# Patient Record
Sex: Female | Born: 1995 | Race: Black or African American | Hispanic: No | Marital: Single | Smoking: Former smoker
Health system: Southern US, Community
[De-identification: ages and names within clinical notes are randomized; demographics above are authoritative.]

## PROBLEM LIST (undated history)

## (undated) DIAGNOSIS — N3944 Nocturnal enuresis: Secondary | ICD-10-CM

## (undated) DIAGNOSIS — E282 Polycystic ovarian syndrome: Secondary | ICD-10-CM

## (undated) DIAGNOSIS — H6690 Otitis media, unspecified, unspecified ear: Secondary | ICD-10-CM

## (undated) DIAGNOSIS — F32A Depression, unspecified: Secondary | ICD-10-CM

## (undated) DIAGNOSIS — D649 Anemia, unspecified: Secondary | ICD-10-CM

## (undated) DIAGNOSIS — F419 Anxiety disorder, unspecified: Secondary | ICD-10-CM

## (undated) HISTORY — DX: Depression, unspecified: F32.A

## (undated) HISTORY — PX: TONSILLECTOMY: SUR1361

## (undated) HISTORY — DX: Polycystic ovarian syndrome: E28.2

## (undated) HISTORY — DX: Nocturnal enuresis: N39.44

## (undated) HISTORY — DX: Anemia, unspecified: D64.9

## (undated) HISTORY — DX: Otitis media, unspecified, unspecified ear: H66.90

## (undated) HISTORY — PX: ADENOIDECTOMY: SUR15

## (undated) HISTORY — DX: Anxiety disorder, unspecified: F41.9

---
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
## 2016-01-12 ENCOUNTER — Emergency Department (HOSPITAL_COMMUNITY)
Admission: EM | Admit: 2016-01-12 | Discharge: 2016-01-12 | Disposition: A | Discharge status: Home / Self Care | Payer: Federal, State, Local not specified - PPO | Source: Home / Self Care | Attending: Family Medicine | Admitting: Family Medicine

## 2016-01-12 ENCOUNTER — Encounter (HOSPITAL_COMMUNITY): Payer: Self-pay

## 2016-01-12 ENCOUNTER — Other Ambulatory Visit (HOSPITAL_COMMUNITY)
Admission: RE | Admit: 2016-01-12 | Discharge: 2016-01-12 | Disposition: A | Discharge status: Home / Self Care | Payer: Federal, State, Local not specified - PPO | Source: Ambulatory Visit | Attending: Family Medicine | Admitting: Family Medicine

## 2016-01-12 DIAGNOSIS — J069 Acute upper respiratory infection, unspecified: Secondary | ICD-10-CM | POA: Diagnosis not present

## 2016-01-12 LAB — POCT RAPID STREP A: STREPTOCOCCUS, GROUP A SCREEN (DIRECT): NEGATIVE

## 2016-01-12 NOTE — ED Notes (Signed)
Patient complains of sore throat cough congestion for the past two weeks Patient states she took a zyrtec one day Denies any fever

## 2016-01-12 NOTE — ED Provider Notes (Signed)
CSN: [id number]     Arrival date & time 01/12/16  1922 History   First MD Initiated Contact with Patient 01/12/16 2040     Chief Complaint  Patient presents with  . Sore Throat   (Consider location/radiation/quality/duration/timing/severity/associated sxs/prior Treatment) HPI Cold symptoms for about 2 weeks. Treated today only with zrytec. Runny nose congestion and sore throat Past Medical History  Diagnosis Date  . OM (otitis media)   . Nocturnal enuresis     hx seen by Dr. Isabel Caprice in the past  . Anemia    Past Surgical History  Procedure Laterality Date  . Adenoidectomy    . Tonsillectomy     Family History  Problem Relation Age of Onset  . Hypertension Mother   . Other Mother     abn ekg but nl echo and ht rx  . Allergy (severe)    . Other      enlarged heart maternal uncle died in sleep age 70  . Diabetes type II     Social History  Substance Use Topics  . Smoking status: Never Smoker   . Smokeless tobacco: None  . Alcohol Use: No   OB History    Gravida Para Term Preterm AB TAB SAB Ectopic Multiple Living   0              Review of Systems ROS +'vesore throat  Denies: HEADACHE, NAUSEA, ABDOMINAL PAIN, CHEST PAIN, CONGESTION, DYSURIA, SHORTNESS OF BREATH   Allergies  Review of patient's allergies indicates no known allergies.  Home Medications   Prior to Admission medications   Medication Sig Start Date End Date Taking? Authorizing Provider  medroxyPROGESTERone (PROVERA) 10 MG tablet Take 1 tablet (10 mg total) by mouth daily. 04/08/14   Brook Rosalin Hawking, MD  meloxicam (MOBIC) 7.5 MG tablet Use 1 tablet for pain, may increase to 2 but not more in one day. 10/27/14   Wallis Bamberg, PA-C  norethindrone-ethinyl estradiol (JUNEL FE,GILDESS FE,LOESTRIN FE) 1-20 MG-MCG tablet Take 1 tablet by mouth daily. 04/23/14   Brook Rosalin Hawking, MD   Meds Ordered and Administered this Visit  Medications - No data to display  BP 148/84 mmHg  Pulse 88   Temp(Src) 98.3 F (36.8 C) (Oral)  Resp 18  SpO2 100% No data found.   Physical Exam  Constitutional: She is oriented to person, place, and time. She appears well-developed and well-nourished.  HENT:  Head: Normocephalic and atraumatic.  Right Ear: External ear normal.  Left Ear: External ear normal.  Eyes: Conjunctivae are normal.  Neck: Normal range of motion. Neck supple.  Pulmonary/Chest: Effort normal and breath sounds normal.  Abdominal: Soft.  Musculoskeletal: Normal range of motion.  Neurological: She is alert and oriented to person, place, and time.  Skin: Skin is warm and dry.  Psychiatric: She has a normal mood and affect. Her behavior is normal.  Nursing note and vitals reviewed.   ED Course  Procedures (including critical care time)  Labs Review Labs Reviewed - No data to display  Imaging Review No results found.   Visual Acuity Review  Right Eye Distance:   Left Eye Distance:   Bilateral Distance:    Right Eye Near:   Left Eye Near:    Bilateral Near:         MDM   1. URI (upper respiratory infection)    Patient is advised to continue home symptomatic treatment.  Patient is advised that if there are new  or worsening symptoms or attend the emergency department, or contact primary care provider. Instructions of care provided discharged home in stable condition. Return to work/school note provided.  THIS NOTE WAS GENERATED USING A VOICE RECOGNITION SOFTWARE PROGRAM. ALL REASONABLE EFFORTS  WERE MADE TO PROOFREAD THIS DOCUMENT FOR ACCURACY.     Tharon Aquas, PA 01/12/16 2059

## 2016-01-12 NOTE — Discharge Instructions (Signed)
Cough, Adult °A cough helps to clear your throat and lungs. A cough may last only 2-3 weeks (acute), or it may last longer than 8 weeks (chronic). Many different things can cause a cough. A cough may be a sign of an illness or another medical condition. °HOME CARE °· Pay attention to any changes in your cough. °· Take medicines only as told by your doctor. °¨ If you were prescribed an antibiotic medicine, take it as told by your doctor. Do not stop taking it even if you start to feel better. °¨ Talk with your doctor before you try using a cough medicine. °· Drink enough fluid to keep your pee (urine) clear or pale yellow. °· If the air is dry, use a cold steam vaporizer or humidifier in your home. °· Stay away from things that make you cough at work or at home. °· If your cough is worse at night, try using extra pillows to raise your head up higher while you sleep. °· Do not smoke, and try not to be around smoke. If you need help quitting, ask your doctor. °· Do not have caffeine. °· Do not drink alcohol. °· Rest as needed. °GET HELP IF: °· You have new problems (symptoms). °· You cough up yellow fluid (pus). °· Your cough does not get better after 2-3 weeks, or your cough gets worse. °· Medicine does not help your cough and you are not sleeping well. °· You have pain that gets worse or pain that is not helped with medicine. °· You have a fever. °· You are losing weight and you do not know why. °· You have night sweats. °GET HELP RIGHT AWAY IF: °· You cough up blood. °· You have trouble breathing. °· Your heartbeat is very fast. °  °This information is not intended to replace advice given to you by your health care provider. Make sure you discuss any questions you have with your health care provider. °  °Document Released: 08/10/2011 Document Revised: 08/18/2015 Document Reviewed: 02/03/2015 °Elsevier Interactive Patient Education ©2016 [employer] ° °Pharyngitis °Pharyngitis is a sore throat (pharynx). There is  redness, pain, and swelling of your throat. °HOME CARE  °· Drink enough fluids to keep your pee (urine) clear or pale yellow. °· Only take medicine as told by your doctor. °¨ You may get sick again if you do not take medicine as told. Finish your medicines, even if you start to feel better. °¨ Do not take aspirin. °· Rest. °· Rinse your mouth (gargle) with salt water (½ tsp of salt per 1 qt of water) every 1-2 hours. This will help the pain. °· If you are not at risk for choking, you can suck on hard candy or sore throat lozenges. °GET HELP IF: °· You have large, tender lumps on your neck. °· You have a rash. °· You cough up green, yellow-brown, or bloody spit. °GET HELP RIGHT AWAY IF:  °· You have a stiff neck. °· You drool or cannot swallow liquids. °· You throw up (vomit) or are not able to keep medicine or liquids down. °· You have very bad pain that does not go away with medicine. °· You have problems breathing (not from a stuffy nose). °MAKE SURE YOU:  °· Understand these instructions. °· Will watch your condition. °· Will get help right away if you are not doing well or get worse. °  °This information is not intended to replace advice given to you by your health care   provider. Make sure you discuss any questions you have with your health care provider. °  °Document Released: 05/15/2008 Document Revised: 09/17/2013 Document Reviewed: 08/04/2013 °Elsevier Interactive Patient Education ©2016 Elsevier Inc. ° °

## 2016-01-15 LAB — CULTURE, GROUP A STREP (THRC)

## 2017-01-12 DIAGNOSIS — A6000 Herpesviral infection of urogenital system, unspecified: Secondary | ICD-10-CM | POA: Insufficient documentation

## 2017-06-03 ENCOUNTER — Emergency Department (HOSPITAL_COMMUNITY): Payer: Federal, State, Local not specified - PPO

## 2017-06-03 ENCOUNTER — Encounter (HOSPITAL_COMMUNITY): Payer: Self-pay | Admitting: Emergency Medicine

## 2017-06-03 ENCOUNTER — Emergency Department (HOSPITAL_COMMUNITY)
Admission: EM | Admit: 2017-06-03 | Discharge: 2017-06-03 | Disposition: A | Discharge status: Home / Self Care | Payer: Federal, State, Local not specified - PPO | Attending: Emergency Medicine | Admitting: Emergency Medicine

## 2017-06-03 DIAGNOSIS — R102 Pelvic and perineal pain: Secondary | ICD-10-CM

## 2017-06-03 DIAGNOSIS — N760 Acute vaginitis: Secondary | ICD-10-CM | POA: Insufficient documentation

## 2017-06-03 DIAGNOSIS — R1030 Lower abdominal pain, unspecified: Secondary | ICD-10-CM | POA: Diagnosis present

## 2017-06-03 DIAGNOSIS — B9689 Other specified bacterial agents as the cause of diseases classified elsewhere: Secondary | ICD-10-CM

## 2017-06-03 DIAGNOSIS — F17200 Nicotine dependence, unspecified, uncomplicated: Secondary | ICD-10-CM | POA: Insufficient documentation

## 2017-06-03 LAB — URINALYSIS, ROUTINE W REFLEX MICROSCOPIC
Bacteria, UA: NONE SEEN
Bilirubin Urine: NEGATIVE
GLUCOSE, UA: NEGATIVE mg/dL
Ketones, ur: NEGATIVE mg/dL
NITRITE: NEGATIVE
PH: 6 (ref 5.0–8.0)
Protein, ur: NEGATIVE mg/dL
RBC / HPF: NONE SEEN RBC/hpf (ref 0–5)
SPECIFIC GRAVITY, URINE: 1.018 (ref 1.005–1.030)

## 2017-06-03 LAB — POC URINE PREG, ED: Preg Test, Ur: NEGATIVE

## 2017-06-03 LAB — BASIC METABOLIC PANEL
ANION GAP: 8 (ref 5–15)
BUN: 11 mg/dL (ref 6–20)
CO2: 25 mmol/L (ref 22–32)
Calcium: 9.7 mg/dL (ref 8.9–10.3)
Chloride: 104 mmol/L (ref 101–111)
Creatinine, Ser: 0.85 mg/dL (ref 0.44–1.00)
Glucose, Bld: 101 mg/dL — ABNORMAL HIGH (ref 65–99)
Potassium: 3.9 mmol/L (ref 3.5–5.1)
Sodium: 137 mmol/L (ref 135–145)

## 2017-06-03 LAB — AMYLASE: AMYLASE: 91 U/L (ref 28–100)

## 2017-06-03 LAB — WET PREP, GENITAL
SPERM: NONE SEEN
Trich, Wet Prep: NONE SEEN
YEAST WET PREP: NONE SEEN

## 2017-06-03 MED ORDER — IBUPROFEN 200 MG PO TABS
600.0000 mg | ORAL_TABLET | Freq: Once | ORAL | Status: AC
  Administered 2017-06-03: 600 mg via ORAL
  Filled 2017-06-03: qty 1

## 2017-06-03 MED ORDER — METRONIDAZOLE 500 MG PO TABS: 500.0000 mg | ORAL_TABLET | Freq: Two times a day (BID) | ORAL | 0 refills | Status: DC

## 2017-06-03 NOTE — ED Provider Notes (Signed)
[facility]-EMERGENCY DEPT Provider Note   CSN: [id number] Arrival date & time: 06/03/17  4098     History   Chief Complaint Chief Complaint  Patient presents with  . Abdominal Pain    HPI April Montes is a 21 y.o. female.  The history is provided by the patient.  Abdominal Pain   This is a recurrent problem. The current episode started 1 to 2 hours ago. The problem occurs constantly. The problem has not changed since onset.The pain is associated with an unknown factor. The pain is located in the suprapubic region. The quality of the pain is sharp. The pain is moderate. Pertinent negatives include fever, diarrhea, nausea, vomiting, dysuria, frequency and hematuria. The symptoms are aggravated by palpation. Nothing relieves the symptoms.   21 year old female who presents with low abdominal pain. She has a history of oligomenorrhea. Has had intermittent low abdominal pain in her pelvis for one or 2 months. Pain of spontaneous onset, lasting for a few hours before self resolving. This episode woke her up from sleep this morning. I did not take anything for pain. No other alleviating factors. No Vaginal bleeding or vaginal discharge, fevers, nausea or vomiting, urinary complaints, diarrhea/constipation. Past Medical History:  Diagnosis Date  . Anemia   . Nocturnal enuresis    hx seen by Dr. Isabel Caprice in the past  . OM (otitis media)     Patient Active Problem List   Diagnosis Date Noted  . Primary oligomenorrhea 03/09/2014  . Irregular periods 03/09/2014  . Well adolescent visit 03/14/2013  . Back pain 01/18/2012  . Poor eating habits 01/18/2012  . ANEMIA 08/26/2010  . SCOLIOSIS 08/12/2009    Past Surgical History:  Procedure Laterality Date  . ADENOIDECTOMY    . TONSILLECTOMY      OB History    Gravida Para Term Preterm AB Living   0             SAB TAB Ectopic Multiple Live Births                   Home Medications    Prior to Admission medications   Medication  Sig Start Date End Date Taking? Authorizing Provider  medroxyPROGESTERone (PROVERA) 10 MG tablet Take 1 tablet (10 mg total) by mouth daily. Patient not taking: Reported on 06/03/2017 04/08/14   Patton Salles, MD  meloxicam (MOBIC) 7.5 MG tablet Use 1 tablet for pain, may increase to 2 but not more in one day. Patient not taking: Reported on 06/03/2017 10/27/14   Wallis Bamberg, PA-C  metroNIDAZOLE (FLAGYL) 500 MG tablet Take 1 tablet (500 mg total) by mouth 2 (two) times daily. 06/03/17   Lavera Guise, MD  norethindrone-ethinyl estradiol (JUNEL FE,GILDESS FE,LOESTRIN FE) 1-20 MG-MCG tablet Take 1 tablet by mouth daily. Patient not taking: Reported on 06/03/2017 04/23/14   Patton Salles, MD    Family History Family History  Problem Relation Age of Onset  . Hypertension Mother   . Other Mother        abn ekg but nl echo and ht rx  . Allergy (severe) Unknown   . Other Unknown        enlarged heart maternal uncle died in sleep age 33  . Diabetes type II Unknown     Social History Social History  Substance Use Topics  . Smoking status: Current Some Day Smoker  . Smokeless tobacco: Current User  . Alcohol use No  Allergies   Patient has no known allergies.   Review of Systems Review of Systems  Constitutional: Negative for fever.  Respiratory: Negative for shortness of breath.   Cardiovascular: Negative for chest pain.  Gastrointestinal: Positive for abdominal pain. Negative for diarrhea, nausea and vomiting.  Genitourinary: Negative for dysuria, frequency and hematuria.  Allergic/Immunologic: Negative for immunocompromised state.  Hematological: Does not bruise/bleed easily.  All other systems reviewed and are negative.    Physical Exam Updated Vital Signs BP 123/76   Pulse 69   Temp 97.8 F (36.6 C) (Oral)   Resp 16   Ht 5' 2.5" (1.588 m)   Wt 55.8 kg (123 lb)   SpO2 99%   BMI 22.14 kg/m   Physical Exam Physical Exam  Nursing note and  vitals reviewed. Constitutional: Well developed, well nourished, non-toxic, and in no acute distress Head: Normocephalic and atraumatic.  Mouth/Throat: Oropharynx is clear and moist.  Neck: Normal range of motion. Neck supple.  Cardiovascular: Normal rate and regular rhythm.   Pulmonary/Chest: Effort normal and breath sounds normal.  Abdominal: Soft. There is low abdominal tenderness. There is no rebound and no guarding.  Pelvic: Normal external genitalia. Normal internal genitalia. Moderate white discharge. No blood within the vagina. No cervical motion tenderness. No adnexal masses. Mild right adnexal. Musculoskeletal: Normal range of motion.  Neurological: Alert, no facial droop, fluent speech, moves all extremities symmetrically Skin: Skin is warm and dry.  Psychiatric: Cooperative   ED Treatments / Results  Labs (all labs ordered are listed, but only abnormal results are displayed) Labs Reviewed  WET PREP, GENITAL - Abnormal; Notable for the following:       Result Value   Clue Cells Wet Prep HPF POC PRESENT (*)    WBC, Wet Prep HPF POC MANY (*)    All other components within normal limits  BASIC METABOLIC PANEL - Abnormal; Notable for the following:    Glucose, Bld 101 (*)    All other components within normal limits  URINALYSIS, ROUTINE W REFLEX MICROSCOPIC - Abnormal; Notable for the following:    Hgb urine dipstick SMALL (*)    Leukocytes, UA MODERATE (*)    Squamous Epithelial / LPF 6-30 (*)    All other components within normal limits  AMYLASE  POC URINE PREG, ED  GC/CHLAMYDIA PROBE AMP ([place]) NOT AT [facility]    EKG  EKG Interpretation None       Radiology US Transvaginal Non-ob  Result Date: 06/03/2017 CLINICAL DATA:  Right pelvic pain for 1 month EXAM: TRANSABDOMINAL AND TRANSVAGINAL ULTRASOUND OF PELVIS DOPPLER ULTRASOUND OF OVARIES TECHNIQUE: Both transabdominal and transvaginal ultrasound examinations of the pelvis were performed. Transabdominal  technique was performed for global imaging of the pelvis including uterus, ovaries, adnexal regions, and pelvic cul-de-sac. It was necessary to proceed with endovaginal exam following the transabdominal exam to visualize the adnexal structures to an adequate degree. Color and duplex Doppler ultrasound was utilized to evaluate blood flow to the ovaries. COMPARISON:  None. FINDINGS: Uterus Measurements: 7.2 x 2 x 3.9 cm. No fibroids or other mass visualized. Endometrium Thickness: Normal at 6 mm.  No focal abnormality visualized. Right ovary Measurements: 4.9 x 2.8 x 3.5 cm. Normal appearance/no adnexal mass. Left ovary Measurements: 3.9 x 3 x 3 cm. Normal appearance/no adnexal mass. Pulsed Doppler evaluation of both ovaries demonstrates normal low-resistance arterial and venous waveforms. Other findings No abnormal free fluid. IMPRESSION: Normal pelvic ultrasound. No evidence of ovarian torsion. No mass or free  fluid in either adnexal region. Uterus appears normal. Electronically Signed   By: Bary RichardStan  Maynard M.D.   On: 06/03/2017 11:38   [place] Pelvis Complete  Result Date: 06/03/2017 CLINICAL DATA:  Right pelvic pain for 1 month EXAM: TRANSABDOMINAL AND TRANSVAGINAL ULTRASOUND OF PELVIS DOPPLER ULTRASOUND OF OVARIES TECHNIQUE: Both transabdominal and transvaginal ultrasound examinations of the pelvis were performed. Transabdominal technique was performed for global imaging of the pelvis including uterus, ovaries, adnexal regions, and pelvic cul-de-sac. It was necessary to proceed with endovaginal exam following the transabdominal exam to visualize the adnexal structures to an adequate degree. Color and duplex Doppler ultrasound was utilized to evaluate blood flow to the ovaries. COMPARISON:  None. FINDINGS: Uterus Measurements: 7.2 x 2 x 3.9 cm. No fibroids or other mass visualized. Endometrium Thickness: Normal at 6 mm.  No focal abnormality visualized. Right ovary Measurements: 4.9 x 2.8 x 3.5 cm. Normal  appearance/no adnexal mass. Left ovary Measurements: 3.9 x 3 x 3 cm. Normal appearance/no adnexal mass. Pulsed Doppler evaluation of both ovaries demonstrates normal low-resistance arterial and venous waveforms. Other findings No abnormal free fluid. IMPRESSION: Normal pelvic ultrasound. No evidence of ovarian torsion. No mass or free fluid in either adnexal region. Uterus appears normal. Electronically Signed   By: Bary RichardStan  Maynard M.D.   On: 06/03/2017 11:38   [place] Art/ven Flow Abd Pelv Doppler  Result Date: 06/03/2017 CLINICAL DATA:  Right pelvic pain for 1 month EXAM: TRANSABDOMINAL AND TRANSVAGINAL ULTRASOUND OF PELVIS DOPPLER ULTRASOUND OF OVARIES TECHNIQUE: Both transabdominal and transvaginal ultrasound examinations of the pelvis were performed. Transabdominal technique was performed for global imaging of the pelvis including uterus, ovaries, adnexal regions, and pelvic cul-de-sac. It was necessary to proceed with endovaginal exam following the transabdominal exam to visualize the adnexal structures to an adequate degree. Color and duplex Doppler ultrasound was utilized to evaluate blood flow to the ovaries. COMPARISON:  None. FINDINGS: Uterus Measurements: 7.2 x 2 x 3.9 cm. No fibroids or other mass visualized. Endometrium Thickness: Normal at 6 mm.  No focal abnormality visualized. Right ovary Measurements: 4.9 x 2.8 x 3.5 cm. Normal appearance/no adnexal mass. Left ovary Measurements: 3.9 x 3 x 3 cm. Normal appearance/no adnexal mass. Pulsed Doppler evaluation of both ovaries demonstrates normal low-resistance arterial and venous waveforms. Other findings No abnormal free fluid. IMPRESSION: Normal pelvic ultrasound. No evidence of ovarian torsion. No mass or free fluid in either adnexal region. Uterus appears normal. Electronically Signed   By: Bary RichardStan  Maynard M.D.   On: 06/03/2017 11:38    Procedures Procedures (including critical care time)  Medications Ordered in ED Medications  ibuprofen  (ADVIL,MOTRIN) tablet 600 mg (600 mg Oral Given 06/03/17 1026)     Initial Impression / Assessment and Plan / ED Course  I have reviewed the triage vital signs and the nursing notes.  Pertinent labs & imaging results that were available during my care of the patient were reviewed by me and considered in my medical decision making (see chart for details).     Presented with pelvic pain, recurrent over the past few months. As well. No acute distress with normal vital signs. Abdomen is overall soft and benign. Pain primarily in the right adnexa. No suspicion for appendicitis. Pelvic exam does reveal evidence of BV will treat with Flagyl. Exam is Not consistent with PID. UA overall unremarkable. Blood work is reassuring. She is not pregnant. Pelvic ultrasound is overall unremarkable. There is no evidence of torsion or cyst. Pain controlled after ibuprofen. Strict  return and follow-up instructions reviewed. She expressed understanding of all discharge instructions and felt comfortable with the plan of care.   Final Clinical Impressions(s) / ED Diagnoses   Final diagnoses:  Pelvic pain  Bacterial vaginosis    New Prescriptions New Prescriptions   METRONIDAZOLE (FLAGYL) 500 MG TABLET    Take 1 tablet (500 mg total) by mouth 2 (two) times daily.     Lavera Guise, MD 06/03/17 1254

## 2017-06-03 NOTE — Discharge Instructions (Signed)
Take flagyl for bacterial vaginosis. Take ibuprofen and tylenol as needed for pain. Return for worsneing symptoms, including fever, worsening pain, intractable vomiting or any other symptoms concerning to you.

## 2017-06-03 NOTE — ED Triage Notes (Signed)
Pt. Stated, I woke up this morning with sharp stomach pains. Lower in my pelvic area. This happened about a month ago .

## 2017-06-04 LAB — GC/CHLAMYDIA PROBE AMP (~~LOC~~) NOT AT ARMC
CHLAMYDIA, DNA PROBE: NEGATIVE
NEISSERIA GONORRHEA: NEGATIVE

## 2019-06-15 ENCOUNTER — Inpatient Hospital Stay (HOSPITAL_COMMUNITY)
Admission: RE | Admit: 2019-06-15 | Discharge: 2019-06-17 | DRG: 885 | Disposition: A | Discharge status: Home / Self Care | Payer: Federal, State, Local not specified - PPO | Attending: Psychiatry | Admitting: Psychiatry

## 2019-06-15 ENCOUNTER — Other Ambulatory Visit: Payer: Self-pay

## 2019-06-15 ENCOUNTER — Encounter (HOSPITAL_COMMUNITY): Payer: Self-pay

## 2019-06-15 DIAGNOSIS — R45851 Suicidal ideations: Secondary | ICD-10-CM | POA: Diagnosis present

## 2019-06-15 DIAGNOSIS — F3341 Major depressive disorder, recurrent, in partial remission: Secondary | ICD-10-CM | POA: Diagnosis present

## 2019-06-15 DIAGNOSIS — F17200 Nicotine dependence, unspecified, uncomplicated: Secondary | ICD-10-CM | POA: Diagnosis present

## 2019-06-15 DIAGNOSIS — Z1159 Encounter for screening for other viral diseases: Secondary | ICD-10-CM

## 2019-06-15 DIAGNOSIS — G4700 Insomnia, unspecified: Secondary | ICD-10-CM | POA: Diagnosis present

## 2019-06-15 DIAGNOSIS — F332 Major depressive disorder, recurrent severe without psychotic features: Secondary | ICD-10-CM | POA: Diagnosis present

## 2019-06-15 LAB — COMPREHENSIVE METABOLIC PANEL
ALT: 33 U/L (ref 0–44)
AST: 28 U/L (ref 15–41)
Albumin: 4.3 g/dL (ref 3.5–5.0)
Alkaline Phosphatase: 72 U/L (ref 38–126)
Anion gap: 13 (ref 5–15)
BUN: 13 mg/dL (ref 6–20)
CO2: 22 mmol/L (ref 22–32)
Calcium: 9.2 mg/dL (ref 8.9–10.3)
Chloride: 107 mmol/L (ref 98–111)
Creatinine, Ser: 0.86 mg/dL (ref 0.44–1.00)
GFR calc Af Amer: >60 mL/min (ref >60)
GFR calc non Af Amer: >60 mL/min (ref >60)
Glucose, Bld: 121 mg/dL — ABNORMAL HIGH (ref 70–99)
Potassium: 3.7 mmol/L (ref 3.5–5.1)
Sodium: 142 mmol/L (ref 135–145)
Total Bilirubin: 0.3 mg/dL (ref 0.3–1.2)
Total Protein: 7.5 g/dL (ref 6.5–8.1)

## 2019-06-15 LAB — CBC
HCT: 42.6 % (ref 36.0–46.0)
Hemoglobin: 13 g/dL (ref 12.0–15.0)
MCH: 23.6 pg — ABNORMAL LOW (ref 26.0–34.0)
MCHC: 30.5 g/dL (ref 30.0–36.0)
MCV: 77.2 fL — ABNORMAL LOW (ref 80.0–100.0)
Platelets: 245 10*3/uL (ref 150–400)
RBC: 5.52 MIL/uL — ABNORMAL HIGH (ref 3.87–5.11)
RDW: 13.8 % (ref 11.5–15.5)
WBC: 7.7 10*3/uL (ref 4.0–10.5)
nRBC: 0 % (ref 0.0–0.2)

## 2019-06-15 LAB — PREGNANCY, URINE: Preg Test, Ur: NEGATIVE

## 2019-06-15 LAB — RAPID URINE DRUG SCREEN, HOSP PERFORMED
Amphetamines: NOT DETECTED
Barbiturates: NOT DETECTED
Benzodiazepines: NOT DETECTED
Cocaine: NOT DETECTED
Opiates: NOT DETECTED
Tetrahydrocannabinol: NOT DETECTED

## 2019-06-15 LAB — SARS CORONAVIRUS 2 BY RT PCR (HOSPITAL ORDER, PERFORMED IN ~~LOC~~ HOSPITAL LAB): SARS Coronavirus 2: NEGATIVE

## 2019-06-15 LAB — TSH: TSH: 1.788 u[IU]/mL (ref 0.350–4.500)

## 2019-06-15 MED ORDER — HYDROXYZINE HCL 25 MG PO TABS: 25.0000 mg | ORAL_TABLET | Freq: Three times a day (TID) | ORAL | Status: DC | PRN

## 2019-06-15 MED ORDER — MAGNESIUM HYDROXIDE 400 MG/5ML PO SUSP: 30.0000 mL | Freq: Every day | ORAL | Status: DC | PRN

## 2019-06-15 MED ORDER — TRAZODONE HCL 50 MG PO TABS
25.0000 mg | ORAL_TABLET | Freq: Every evening | ORAL | Status: DC | PRN
  Filled 2019-06-15: qty 1

## 2019-06-15 MED ORDER — TRAZODONE HCL 50 MG PO TABS: 50.0000 mg | ORAL_TABLET | Freq: Every evening | ORAL | Status: DC | PRN

## 2019-06-15 MED ORDER — ACETAMINOPHEN 325 MG PO TABS: 650.0000 mg | ORAL_TABLET | Freq: Four times a day (QID) | ORAL | Status: DC | PRN

## 2019-06-15 MED ORDER — SERTRALINE HCL 25 MG PO TABS
25.0000 mg | ORAL_TABLET | Freq: Every day | ORAL | Status: DC
  Administered 2019-06-15 – 2019-06-17 (×3): 25 mg via ORAL
  Filled 2019-06-15 (×6): qty 1

## 2019-06-15 MED ORDER — ALUM & MAG HYDROXIDE-SIMETH 200-200-20 MG/5ML PO SUSP: 30.0000 mL | ORAL | Status: DC | PRN

## 2019-06-15 MED ORDER — HYDROXYZINE HCL 10 MG PO TABS
10.0000 mg | ORAL_TABLET | Freq: Three times a day (TID) | ORAL | Status: DC | PRN
  Filled 2019-06-15: qty 1

## 2019-06-15 NOTE — Progress Notes (Signed)
Patient ID: April Montes, female   DOB: Apr 28, 1996, 23 y.o.   MRN: [id number]  Admission Note  D) Patient admitted to the adult unit 400 hall at 1015. Patient is a 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 who presents voluntary to [facility]. Patient presents calm, pleasant and cooperative during the admission process. Patient affect appears sad/sullen and reports suicidal thoughts with increased depression. Patient states she has had several plans to end her life and has attempted suicide in middle school by overdose. Patient denies ever receiving treatment and states she is not currently on medications. Patient states she does not have a therapist. Patient cannot idenitfy triggers but expresses complaints of isolation, depression, poor sleep and decreased appetite. Patient identified her sister as a support to her. While here, patient reports wanting to work on "coping with depression" and "talking more about my feelings". Patient lives with her parents and plans to return there on discharge.  Skin assessment was completed and unremarkable except for SF cuts to her L wrist. Patient belongings searched with no contraband found. Cell phone, shorts with strings and pants with strings secured in locker #47. Vital signs obtained. Snacks and fluids offered.    A) Plan of care, unit policies and patient expectations were explained. Written consents obtained. Patient oriented to the unit and their room. Patient placed on standard q15 safety checks. Low fall risk precautions initiated and reviewed with patient.   R) Patient is in no acute distress and verbalizes understanding of information provided. Patient with no concerns at this time. Patient contracts for safety with staff on the unit. Report given to receiving RN.

## 2019-06-15 NOTE — Progress Notes (Signed)
D.  Pt in bed on approach, brief interaction.  Pt is a new admission to the unit today.  Pt has remained in bed thus far this shift.  No complaints voiced.  Pt denies SI/HI/AVH at this time.  A. Support and encouragement offered  R.  Pt remains safe on the unit.

## 2019-06-15 NOTE — [facility] Group Notes (Signed)
[place] LCSW Group Therapy Note  06/15/2019   10:00-11:00AM  Type of Therapy and Topic:  Group Therapy:  Unhealthy versus Healthy Supports, Which Am I?  Participation Level:  Active   Description of Group:  Patients in this group were introduced to the concept that additional supports including self-support are an essential part of recovery.  Initially a discussion was held about the differences between healthy versus unhealthy supports.  Patients were asked to share what healthy supports are helping them to achieve their goals and what unhealthy supports in their lives need to be addressed.   A song entitled "My Own Hero" was played and a group discussion ensued in which patients stated they could relate to the song and it inspired them to realize they have be willing to help themselves in order to succeed, because other people cannot achieve sobriety or stability for them.  We discussed adding a variety of healthy supports to address the various needs in patient lives, including becoming more self-supportive.  A song was then played called "I Am Enough" and people stated they related to this when it talked about being your own worst enemy.  A song was played called "I Know Where I've Been" toward the end of group and used to conduct an inspirational wrap-up to group of remembering how far they have already come in their journey.  Therapeutic Goals: 1)  Highlight the differences between healthy and unhealthy supports 2)  Suggest the importance of being a part of one's own support system  3)  Identify the patient's current support system   4) Elicit commitments to add healthy supports and to become more conscious of being self-supportive   Summary of Patient Progress:  The patient listed as healthy supports the following:  Her family.  The patient expressed that unhealthy supports to be addressed include friends.  Patient feels she is sometimes a healthy support for herself and sometimes unhealthy.   Healthy supports which could be added for increased stability and happiness include professionals.  Therapeutic Modalities:   Motivational Interviewing Activity  Maretta Los , MSW, LCSW

## 2019-06-15 NOTE — [facility] Suicide Risk Assessment (Signed)
April Montes   Nursing information obtained from:  Patient, Review of record Demographic factors:  Adolescent or young adult Current Mental Status:  Suicidal ideation indicated by patient, Suicide plan, Self-harm thoughts, Self-harm behaviors Loss Factors:  Legal issues Historical Factors:  Prior suicide attempts Risk Reduction Factors:  Positive social support, Living with another person, especially a relative, Sense of responsibility to family  Total Time spent with patient: 30 minutes Principal Problem: <principal problem not specified> Diagnosis:  Active Problems:   MDD (major depressive disorder), recurrent severe, without psychosis ([name])  Subjective Data: Patient is seen and examined.  Patient is a 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 with a probable past psychiatric history significant for major depression as well as generalized anxiety disorder who presented as a walk-in patient to the behavioral health hospital on 06/15/2019.  The patient stated that she had been under stress recently, and was feeling more depressed, more anxious and thinking about killing herself.  She really did not cite any specific issues that led to the increased stress.  She stated that she had been depressed and anxious for many years.  She had overdosed as an adolescent, and discussed it with her family after the fact, but the patient never saw psychiatry, counseling or anything else with regard to behavioral treatment.  She denied any previous psychiatric medications.  She denied any physical, emotional or sexual trauma in the past.  She has a family history significant for bipolar disorder, but denied any episodes of excessive spending, increased goal-directed activity, or euphoria.  She also stated that she had "heard voices", but these seem to be inside her head versus hallucinations.  She stated that she already felt better being here, and being able to talk about things in an open way.  She denied current  suicidal ideation.  Continued Clinical Symptoms:  Alcohol Use Disorder Identification Test Final Score (AUDIT): 0 The "Alcohol Use Disorders Identification Test", Guidelines for Use in Primary Care, Second Edition.  World Pharmacologist April Montes). Score between 0-7:  no or low risk or alcohol related problems. Score between 8-15:  moderate risk of alcohol related problems. Score between 16-19:  high risk of alcohol related problems. Score 20 or above:  warrants further diagnostic evaluation for alcohol dependence and treatment.   CLINICAL FACTORS:   Severe Anxiety and/or Agitation Depression:   Anhedonia Hopelessness Impulsivity Insomnia   Musculoskeletal: Strength & Muscle Tone: within normal limits Gait & Station: normal Patient leans: N/A  Psychiatric Specialty Exam: Physical Exam  Nursing note and vitals reviewed. Constitutional: She is oriented to person, place, and time. She appears well-developed and well-nourished.  HENT:  Head: Normocephalic and atraumatic.  Respiratory: Effort normal.  Neurological: She is alert and oriented to person, place, and time.    ROS  Blood pressure (!) 115/94, pulse 73, temperature 99 F (37.2 C), temperature source Oral, resp. rate 18, height 5\' 2"  (1.575 m), weight 65.8 kg, SpO2 100 %.Body mass index is 26.52 kg/m.  General Appearance: Casual  Eye Contact:  Good  Speech:  Normal Rate  Volume:  Normal  Mood:  Anxious and Depressed  Affect:  Congruent  Thought Process:  Coherent and Descriptions of Associations: Intact  Orientation:  Full (Time, Place, and Person)  Thought Content:  Hallucinations: Auditory  Suicidal Thoughts:  Yes.  without intent/plan  Homicidal Thoughts:  No  Memory:  Immediate;   Fair Recent;   Fair Remote;   Fair  Judgement:  Intact  Insight:  Fair  Psychomotor  Activity:  Normal  Concentration:  Concentration: Fair and Attention Span: Fair  Recall:  [name]  Fund of Knowledge:  Fair  Language:  Good   Akathisia:  Negative  Handed:  Right  AIMS (if indicated):     Assets:  Desire for Improvement Resilience  ADL's:  Intact  Cognition:  WNL  Sleep:         COGNITIVE FEATURES THAT CONTRIBUTE TO RISK:  None    SUICIDE RISK:   Mild:  Suicidal ideation of limited frequency, intensity, duration, and specificity.  There are no identifiable plans, no associated intent, mild dysphoria and related symptoms, good self-control (both objective and subjective Montes), few other risk factors, and identifiable protective factors, including available and accessible social support.  PLAN OF CARE: Patient is seen and examined.  Patient is a 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 with a probable past psychiatric history significant for major depression as well as generalized anxiety disorder she will be admitted to the hospital.  She will be integrated into the milieu.  She will be encouraged to attend groups and work on Pharmacologistcoping skills.  She will be started on Zoloft 25 mg p.o. daily and this to be titrated during the course the hospitalization.  She will also be written for hydroxyzine 10 mg p.o. every 6 hours as needed anxiety.  She will also have available trazodone 50 mg p.o. nightly as needed insomnia.  We will collect collateral information from her parents, and hopefully be able to benefit her in the short run.  She does have a family history of bipolar disorder, but answers negative to essentially all the questions related to bipolar disorder.  I certify that inpatient services furnished can reasonably be expected to improve the patient's condition.   April PertGreg Lawson Rilie Glanz, MD 06/15/2019, 12:56 PM

## 2019-06-15 NOTE — Tx Team (Signed)
Initial Treatment Plan 06/15/2019 10:36 AM April Montes ULA:[id number]    PATIENT STRESSORS: Legal issue Other: ineffective coping   PATIENT STRENGTHS: Average or above average intelligence General fund of knowledge Motivation for treatment/growth Physical Health   PATIENT IDENTIFIED PROBLEMS: "learn to cope with my depression"  "talk about feelings more"  Suicide Risk                 DISCHARGE CRITERIA:  Ability to meet basic life and health needs Adequate post-discharge living arrangements Improved stabilization in mood, thinking, and/or behavior Medical problems require only outpatient monitoring Motivation to continue treatment in a less acute level of care Need for constant or close observation no longer present Reduction of life-threatening or endangering symptoms to within safe limits Safe-care adequate arrangements made Verbal commitment to aftercare and medication compliance  PRELIMINARY DISCHARGE PLAN: Outpatient therapy  PATIENT/FAMILY INVOLVEMENT: This treatment plan has been presented to and reviewed with the patient, April Montes.  The patient and family have been given the opportunity to ask questions and make suggestions.  Annia Friendly, RN 06/15/2019, 10:36 AM

## 2019-06-15 NOTE — H&P (Signed)
Behavioral Health Medical Screening Exam  April Montes is an 23 y.o. female.  Total Time spent with patient: 20 minutes  Psychiatric Specialty Exam: Physical Exam  Nursing note and vitals reviewed. Constitutional: She is oriented to person, place, and time. She appears well-developed and well-nourished.  Cardiovascular: Normal rate.  Respiratory: Effort normal.  Musculoskeletal: Normal range of motion.  Neurological: She is alert and oriented to person, place, and time.  Skin: Skin is warm.    Review of Systems  Constitutional: Negative.   HENT: Negative.   Eyes: Negative.   Respiratory: Negative.   Cardiovascular: Negative.   Gastrointestinal: Negative.   Genitourinary: Negative.   Musculoskeletal: Negative.   Skin: Negative.   Neurological: Negative.   Endo/Heme/Allergies: Negative.   Psychiatric/Behavioral: Positive for depression, hallucinations and suicidal ideas. The patient is nervous/anxious.     Blood pressure (!) 131/91, pulse 87, temperature 98.5 F (36.9 C), SpO2 100 %.There is no height or weight on file to calculate BMI.  General Appearance: Casual  Eye Contact:  Minimal  Speech:  Clear and Coherent and Normal Rate  Volume:  Decreased  Mood:  Depressed  Affect:  Flat  Thought Process:  Coherent and Descriptions of Associations: Intact  Orientation:  Full (Time, Place, and Person)  Thought Content:  WDL  Suicidal Thoughts:  Yes.  with intent/plan  Homicidal Thoughts:  No  Memory:  Immediate;   Good Recent;   Good Remote;   Good  Judgement:  Fair  Insight:  Lacking  Psychomotor Activity:  Normal  Concentration: Concentration: Good and Attention Span: Good  Recall:  Good  Fund of Knowledge:Good  Language: Good  Akathisia:  No  Handed:  Right  AIMS (if indicated):     Assets:  Communication Skills Desire for Improvement Housing Physical Health Social Support Transportation  Sleep:       Musculoskeletal: Strength & Muscle Tone: within  normal limits Gait & Station: normal Patient leans: N/A  Blood pressure (!) 131/91, pulse 87, temperature 98.5 F (36.9 C), SpO2 100 %.  Recommendations:  Based on my evaluation the patient does not appear to have an emergency medical condition.  [place], FNP 06/15/2019, 8:31 AM

## 2019-06-15 NOTE — BH Assessment (Signed)
Assessment Note  April Montes is a 23 y.o. female walk-in at [facility] with suicidal thoughts.  Pt states "my sister said I should come to be treated because I cut myself today.  I been having suicidal thoughts for a while.  Sometimes when I'm driving this voices tell me I should drive into a tree."  Pt reports having a history of cutting.  Pt states "I use to cut when I was in middle school."  Pt denies recently cutting before today.  Pt denies HI/SA and V-hallucinations.    Pt reports that she resides with her parents.  Pt reports that she is the youngest of 5 children.  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  Pt reports that she works 40 hours a week.  Pt denies a history of inpatient/outpatient MH/SA treatment. Pt denies a history of physical, sexual, and verbal abuse.  Patient was wearing a sweatsuit and appeared appropriately groomed.  Pt was alert throughout the assessment.  Patient made fair eye contact and had normal psychomotor activity.  Patient spoke in a soft voice without pressured speech.  Pt expressed feeling depressed.  Pt's affect appeared dysphoric and congruent with stated mood. Pt's thought process was coherent and logical.  Pt did not appear to be responding to internal stimuli.  Pt presented with partial insight and judgement.  Disposition: [place] discussed case with [place] Provider, Marvia Pickles, NP who recommends inpatient treatment.  Diagnosis: F32.3 Major Depressive Disorder, with psychotic features   Past Medical History:  Past Medical History:  Diagnosis Date  . Anemia   . Nocturnal enuresis    hx seen by Dr. Risa Grill in the past  . OM (otitis media)     Past Surgical History:  Procedure Laterality Date  . ADENOIDECTOMY    . TONSILLECTOMY      Family History:  Family History  Problem Relation Age of Onset  . Hypertension Mother   . Other Mother        abn ekg but nl echo and ht rx  . Allergy (severe) Other   . Other Other        enlarged heart  maternal uncle died in sleep age 56  . Diabetes type II Other     Social History:  reports that she has been smoking. She uses smokeless tobacco. She reports that she does not drink alcohol or use drugs.  Additional Social History:  Alcohol / Drug Use Pain Medications: See MARs Prescriptions: See MARs Over the Counter: See MARs History of alcohol / drug use?: No history of alcohol / drug abuse  CIWA: CIWA-Ar BP: (!) 131/91 Pulse Rate: 87 COWS:    Allergies: No Known Allergies  Home Medications: (Not in a hospital admission)   OB/GYN Status:  No LMP recorded.  General Assessment Data Location of Assessment: [facility] Assessment Services TTS Assessment: In system Is this a Tele or Face-to-Face Assessment?: Face-to-Face Is this an Initial Assessment or a Re-assessment for this encounter?: Initial Assessment Patient Accompanied by:: N/A Language Other than English: No Living Arrangements: Other (Comment)(Pt lives with parents) What gender do you identify as?: Female Marital status: Single Maiden name: Andreason Pregnancy Status: Unknown Living Arrangements: Parent Can pt return to current living arrangement?: Yes Admission Status: Voluntary Is patient capable of signing voluntary admission?: Yes Referral Source: Self/Family/Friend Insurance type: Effort Screening Exam ([place]) Medical Exam completed: Yes  Crisis Care Plan Living Arrangements: Parent Name of Psychiatrist: None Name of  Therapist: None  Education Status Is patient currently in school?: Yes Current Grade: Junior in [employer] Highest grade of school patient has completed: High School Name of school: College  Risk to self with the past 6 months Suicidal Ideation: Yes-Currently Present Has patient been a risk to self within the past 6 months prior to admission? : Yes Suicidal Intent: Yes-Currently Present Has patient had any suicidal intent within the past 6 months prior to admission? :  Yes Is patient at risk for suicide?: Yes Suicidal Plan?: Yes-Currently Present Has patient had any suicidal plan within the past 6 months prior to admission? : Yes Specify Current Suicidal Plan: cut herself, run her car into a tree Access to Means: Yes Specify Access to Suicidal Means: vehicle, knives Previous Attempts/Gestures: No Triggers for Past Attempts: None known Intentional Self Injurious Behavior: Cutting Comment - Self Injurious Behavior: pt reports cutting in middle school Family Suicide History: Unknown Persecutory voices/beliefs?: Yes Depression: Yes Depression Symptoms: Insomnia, Tearfulness, Isolating, Feeling worthless/self pity Substance abuse history and/or treatment for substance abuse?: No Suicide prevention information given to non-admitted patients: Not applicable  Risk to Others within the past 6 months Homicidal Ideation: No Does patient have any lifetime risk of violence toward others beyond the six months prior to admission? : No Thoughts of Harm to Others: No Current Homicidal Intent: No Current Homicidal Plan: No Access to Homicidal Means: No History of harm to others?: No Assessment of Violence: None Noted Does patient have access to weapons?: No Criminal Charges Pending?: No Does patient have a court date: No Is patient on probation?: No  Psychosis Hallucinations: Auditory, With command Delusions: None noted  Mental Status Report Appearance/Hygiene: Layered clothes Eye Contact: Fair Motor Activity: Freedom of movement Speech: Logical/coherent, Soft Level of Consciousness: Alert, Quiet/awake, Crying Mood: Depressed, Anxious Affect: Anxious Anxiety Level: Minimal Thought Processes: Coherent, Relevant Judgement: Partial Orientation: Person, Place, Time, Appropriate for developmental age Obsessive Compulsive Thoughts/Behaviors: Minimal  Cognitive Functioning Concentration: Normal Memory: Recent Intact, Remote Intact Is patient IDD:  No Insight: Fair Impulse Control: Poor Appetite: Poor Have you had any weight changes? : No Change Sleep: Decreased Total Hours of Sleep: 2 Vegetative Symptoms: None  ADLScreening [facility] Assessment Services) Patient's cognitive ability adequate to safely complete daily activities?: Yes Patient able to express need for assistance with ADLs?: Yes Independently performs ADLs?: Yes (appropriate for developmental age)  Prior Inpatient Therapy Prior Inpatient Therapy: No  Prior Outpatient Therapy Prior Outpatient Therapy: No Does patient have an ACCT team?: No Does patient have Intensive In-House Services?  : No Does patient have [facility] services? : No Does patient have P4CC services?: No  ADL Screening (condition at time of admission) Patient's cognitive ability adequate to safely complete daily activities?: Yes Is the patient deaf or have difficulty hearing?: No Does the patient have difficulty seeing, even when wearing glasses/contacts?: No Does the patient have difficulty concentrating, remembering, or making decisions?: No Patient able to express need for assistance with ADLs?: Yes Does the patient have difficulty dressing or bathing?: No Independently performs ADLs?: Yes (appropriate for developmental age) Does the patient have difficulty walking or climbing stairs?: No Weakness of Legs: None Weakness of Arms/Hands: None  Home Assistive Devices/Equipment Home Assistive Devices/Equipment: None    Abuse/Neglect Assessment (Assessment to be complete while patient is alone) Abuse/Neglect Assessment Can Be Completed: Yes Physical Abuse: Denies Verbal Abuse: Denies Sexual Abuse: Denies Exploitation of patient/patient's resources: Denies Self-Neglect: Denies     Merchant navy officerAdvance Directives (For Healthcare) Does Patient Have  a Medical Advance Directive?: No Would patient like information on creating a medical advance directive?: No - Patient declined Nutrition Screen- MC  Adult/WL/AP Patient's home diet: NPO        [facility] case with [facility] Provider, Reola Calkinsravis Money, NP who recommends inpatient treatment.  Disposition Initial Assessment Completed for this Encounter: Yes Disposition of Patient: Admit(Per Reola Calkinsravis Money, NP) Type of inpatient treatment program: Adult Patient refused recommended treatment: No  On Site Evaluation by:   Reviewed with Physician:    Tyron Russellhristel L Ziere Docken, MS, LCMHC, NCC 06/15/2019 8:42 AM

## 2019-06-15 NOTE — H&P (Signed)
Psychiatric Admission Assessment Adult  Patient Identification: April Montes MRN:  [id number] Date of Evaluation:  06/15/2019 Chief Complaint:  MDD, rec, with psychotic features Principal Diagnosis: <principal problem not specified> Diagnosis:  Active Problems:   MDD (major depressive disorder), recurrent severe, without psychosis (HCC)  History of Present Illness:  Patient is seen and examined.  Patient is a 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 with a probable past psychiatric history significant for major depression as well as generalized anxiety disorder who presented as a walk-in patient to the behavioral health hospital on 06/15/2019.  The patient stated that she had been under stress recently, and was feeling more depressed, more anxious and thinking about killing herself.  She really did not cite any specific issues that led to the increased stress.  She stated that she had been depressed and anxious for many years.  She had overdosed as an adolescent, and discussed it with her family after the fact, but the patient never saw psychiatry, counseling or anything else with regard to behavioral treatment.  She denied any previous psychiatric medications.  She denied any physical, emotional or sexual trauma in the past.  She has a family history significant for bipolar disorder, but denied any episodes of excessive spending, increased goal-directed activity, or euphoria.  She also stated that she had "heard voices", but these seem to be inside her head versus hallucinations.  She stated that she already felt better being here, and being able to talk about things in an open way.  She denied current suicidal ideation.  Associated Signs/Symptoms: Depression Symptoms:  depressed mood, anhedonia, insomnia, psychomotor agitation, fatigue, feelings of worthlessness/guilt, difficulty concentrating, hopelessness, suicidal thoughts without plan, anxiety, (Hypo) Manic Symptoms:  Impulsivity, Irritable  Mood, Labiality of Mood, Anxiety Symptoms:  Excessive Worry, Psychotic Symptoms:  Hallucinations: Auditory PTSD Symptoms: Negative Total Time spent with patient: 30 minutes  Past Psychiatric History: Patient denied any previous psychiatric evaluations, psychiatric treatment, or psychiatric admissions to the hospital.  Is the patient at risk to self? Yes.    Has the patient been a risk to self in the past 6 months? Yes.    Has the patient been a risk to self within the distant past? Yes.    Is the patient a risk to others? No.  Has the patient been a risk to others in the past 6 months? No.  Has the patient been a risk to others within the distant past? No.   Prior Inpatient Therapy: Prior Inpatient Therapy: No Prior Outpatient Therapy: Prior Outpatient Therapy: No Does patient have an ACCT team?: No Does patient have Intensive In-House Services?  : No Does patient have [facility] services? : No Does patient have P[facility] services?: No  Alcohol Screening: 1. How often do you have a drink containing alcohol?: Never 2. How many drinks containing alcohol do you have on a typical day when you are drinking?: 1 or 2 3. How often do you have six or more drinks on one occasion?: Never AUDIT-C Score: 0 9. Have you or someone else been injured as a result of your drinking?: No 10. Has a relative or friend or a doctor or another health worker been concerned about your drinking or suggested you cut down?: No Alcohol Use Disorder Identification Test Final Score (AUDIT): 0 Alcohol Brief Interventions/Follow-up: AUDIT Score <7 follow-up not indicated Substance Abuse History in the last 12 months:  No. Consequences of Substance Abuse: Negative Previous Psychotropic Medications: No  Psychological Evaluations: No  Past Medical History:  Past Medical  History:  Diagnosis Date  . Anemia   . Nocturnal enuresis    hx seen by Dr. Risa Grill in the past  . OM (otitis media)     Past Surgical History:   Procedure Laterality Date  . ADENOIDECTOMY    . TONSILLECTOMY     Family History:  Family History  Problem Relation Age of Onset  . Hypertension Mother   . Other Mother        abn ekg but nl echo and ht rx  . Allergy (severe) Other   . Other Other        enlarged heart maternal uncle died in sleep age 29  . Diabetes type II Other    Family Psychiatric  History: Bipolar disorder in both sides of her family. Tobacco Screening: Have you used any form of tobacco in the last 30 days? (Cigarettes, Smokeless Tobacco, Cigars, and/or Pipes): No Social History:  Social History   Substance and Sexual Activity  Alcohol Use No     Social History   Substance and Sexual Activity  Drug Use No    Additional Social History: Marital status: Single    Pain Medications: See MARs Prescriptions: See MARs Over the Counter: See MARs History of alcohol / drug use?: No history of alcohol / drug abuse                    Allergies:  No Known Allergies Lab Results:  Results for orders placed or performed during the hospital encounter of 06/15/19 (from the past 48 hour(s))  SARS Coronavirus 2 (CEPHEID - Performed in [facility] hospital lab), Hosp Order     Status: None   Collection Time: 06/15/19  8:39 AM   Specimen: Nasopharyngeal Swab  Result Value Ref Range   SARS Coronavirus 2 NEGATIVE NEGATIVE    Comment: (NOTE) If result is NEGATIVE SARS-CoV-2 target nucleic acids are NOT DETECTED. The SARS-CoV-2 RNA is generally detectable in upper and lower  respiratory specimens during the acute phase of infection. The lowest  concentration of SARS-CoV-2 viral copies this assay can detect is 250  copies / mL. A negative result does not preclude SARS-CoV-2 infection  and should not be used as the sole basis for treatment or other  patient management decisions.  A negative result may occur with  improper specimen collection / handling, submission of specimen other  than nasopharyngeal swab,  presence of viral mutation(s) within the  areas targeted by this assay, and inadequate number of viral copies  (<250 copies / mL). A negative result must be combined with clinical  observations, patient history, and epidemiological information. If result is POSITIVE SARS-CoV-2 target nucleic acids are DETECTED. The SARS-CoV-2 RNA is generally detectable in upper and lower  respiratory specimens dur ing the acute phase of infection.  Positive  results are indicative of active infection with SARS-CoV-2.  Clinical  correlation with patient history and other diagnostic information is  necessary to determine patient infection status.  Positive results do  not rule out bacterial infection or co-infection with other viruses. If result is PRESUMPTIVE POSTIVE SARS-CoV-2 nucleic acids MAY BE PRESENT.   A presumptive positive result was obtained on the submitted specimen  and confirmed on repeat testing.  While 2019 novel coronavirus  (SARS-CoV-2) nucleic acids may be present in the submitted sample  additional confirmatory testing may be necessary for epidemiological  and / or clinical management purposes  to differentiate between  SARS-CoV-2 and other Sarbecovirus currently known to infect  humans.  If clinically indicated additional testing with an alternate test  methodology [phone]([id number]) is advised. The SARS-CoV-2 RNA is generally  detectable in upper and lower respiratory sp ecimens during the acute  phase of infection. The expected result is Negative. Fact Sheet for Patients:  BoilerBrush.com.cyhttps://www.fda.gov/media/136312/download Fact Sheet for Healthcare Providers: https://pope.com/https://www.fda.gov/media/136313/download This test is not yet approved or cleared by the [place] FDA and has been authorized for detection and/or diagnosis of SARS-CoV-2 by FDA under an Emergency Use Authorization (EUA).  This EUA will remain in effect (meaning this test can be used) for the duration of the COVID-19 declaration under  Section 564(b)(1) of the Act, 21 U.S.C. section 360bbb-3(b)(1), unless the authorization is terminated or revoked sooner. Performed at [facility], [address] [place], [place] [address]     Blood Alcohol level:  No results found for: [facility]  Metabolic Disorder Labs:  No results found for: HGBA1C, MPG Lab Results  Component Value Date   PROLACTIN 12.8 04/08/2014   No results found for: CHOL, TRIG, HDL, CHOLHDL, VLDL, LDLCALC  Current Medications: Current Facility-Administered Medications  Medication Dose Route Frequency Provider Last Rate Last Dose  . acetaminophen (TYLENOL) tablet 650 mg  650 mg Oral Q6H PRN Money, Gerlene Burdockravis B, FNP      . alum & mag hydroxide-simeth (MAALOX/MYLANTA) 200-200-20 MG/5ML suspension 30 mL  30 mL Oral Q4H PRN Money, Gerlene Burdockravis B, FNP      . hydrOXYzine (ATARAX/VISTARIL) tablet 10 mg  10 mg Oral TID PRN Antonieta Pertlary, Rhia Blatchford Lawson, MD      . magnesium hydroxide (MILK OF MAGNESIA) suspension 30 mL  30 mL Oral Daily PRN Money, Gerlene Burdockravis B, FNP      . sertraline (ZOLOFT) tablet 25 mg  25 mg Oral Daily Antonieta Pertlary, Allex Lapoint Lawson, MD      . traZODone (DESYREL) tablet 25 mg  25 mg Oral QHS PRN Antonieta Pertlary, Zayra Devito Lawson, MD       PTA Medications: No medications prior to admission.    Musculoskeletal: Strength & Muscle Tone: within normal limits Gait & Station: normal Patient leans: N/A  Psychiatric Specialty Exam: Physical Exam  Nursing note and vitals reviewed. Constitutional: She is oriented to person, place, and time. She appears well-developed and well-nourished.  HENT:  Head: Normocephalic and atraumatic.  Respiratory: Effort normal.  Neurological: She is alert and oriented to person, place, and time.    ROS  Blood pressure (!) 115/94, pulse 73, temperature 99 F (37.2 C), temperature source Oral, resp. rate 18, height 5\' 2"  (1.575 m), weight 65.8 kg, SpO2 100 %.Body mass index is 26.52 kg/m.  General Appearance: Casual  Eye Contact:  Good  Speech:   Normal Rate  Volume:  Normal  Mood:  Anxious and Depressed  Affect:  Congruent  Thought Process:  Coherent and Descriptions of Associations: Intact  Orientation:  Full (Time, Place, and Person)  Thought Content:  Logical  Suicidal Thoughts:  No  Homicidal Thoughts:  No  Memory:  Immediate;   Fair Recent;   Fair Remote;   Fair  Judgement:  Intact  Insight:  Fair  Psychomotor Activity:  Increased  Concentration:  Concentration: Good and Attention Span: Good  Recall:  Good  Fund of Knowledge:  Good  Language:  Good  Akathisia:  Negative  Handed:  Right  AIMS (if indicated):     Assets:  Desire for Improvement Resilience  ADL's:  Intact  Cognition:  WNL  Sleep:       Treatment Plan  Summary: Daily contact with patient to assess and evaluate symptoms and progress in treatment, Medication management and Plan :Patient is seen and examined.  Patient is a 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 with a probable past psychiatric history significant for major depression as well as generalized anxiety disorder she will be admitted to the hospital.  She will be integrated into the milieu.  She will be encouraged to attend groups and work on Pharmacologistcoping skills.  She will be started on Zoloft 25 mg p.o. daily and this to be titrated during the course the hospitalization.  She will also be written for hydroxyzine 10 mg p.o. every 6 hours as needed anxiety.  She will also have available trazodone 50 mg p.o. nightly as needed insomnia.  We will collect collateral information from her parents, and hopefully be able to benefit her in the short run.  She does have a family history of bipolar disorder, but answers negative to essentially all the questions related to bipolar disorder.  Observation Level/Precautions:  15 minute checks  Laboratory:  Chemistry Profile  Psychotherapy:    Medications:    Consultations:    Discharge Concerns:    Estimated LOS:  Other:     Physician Treatment Plan for Primary Diagnosis: <principal  problem not specified> Long Term Goal(s): Improvement in symptoms so as ready for discharge  Short Term Goals: Ability to identify changes in lifestyle to reduce recurrence of condition will improve, Ability to verbalize feelings will improve, Ability to disclose and discuss suicidal ideas, Ability to demonstrate self-control will improve, Ability to identify and develop effective coping behaviors will improve and Ability to maintain clinical measurements within normal limits will improve  Physician Treatment Plan for Secondary Diagnosis: Active Problems:   MDD (major depressive disorder), recurrent severe, without psychosis (HCC)  Long Term Goal(s): Improvement in symptoms so as ready for discharge  Short Term Goals: Ability to identify changes in lifestyle to reduce recurrence of condition will improve, Ability to verbalize feelings will improve, Ability to disclose and discuss suicidal ideas, Ability to demonstrate self-control will improve, Ability to identify and develop effective coping behaviors will improve and Ability to maintain clinical measurements within normal limits will improve  I certify that inpatient services furnished can reasonably be expected to improve the patient's condition.    Antonieta PertGreg Lawson Keilin Gamboa, MD 7/5/20201:24 PM

## 2019-06-15 NOTE — [facility] Group Notes (Signed)
[facility] Group Notes: Nursing Education Group  Date:  06/15/2019  Time:  1:15 PM  Facilitator: Patty D., RN  Type of Therapy:  Nurse Education  Participation Level:  Did Not Attend  Summary of Progress/Problems: Patient was invited but did not attend group.  April Montes A April Montes 06/15/2019, 1:15 PM 

## 2019-06-16 LAB — HEMOGLOBIN A1C
Hgb A1c MFr Bld: 5.8 % — ABNORMAL HIGH (ref 4.8–5.6)
Mean Plasma Glucose: 119.76 mg/dL

## 2019-06-16 NOTE — Progress Notes (Signed)
[facility]  06/16/2019 11:10 AM April Montes  MRN:  [id number] Subjective:  "I'm good."  Ms. April Montes found sitting in bed. She presents with bright affect and reports improved mood today. She reports being around people and talking about her problems has helped with the depression. She denies SI. She reports her family are supportive but she has not been speaking recently with them about the problems with anxiety and depression. She plans to follow up with therapy after discharge and to be more open with her parents as well, whom she lives with. She reports having a mild headache since starting Zoloft but denies need for pain medicine. Denies other medication side effects. She had reported "hearing voices" on admission but clarifies those were bad thoughts about hurting herself rather than actual voices. Denies any current or past auditory hallucinations.  From admission H&P: Patient is a 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 with a probable past psychiatric history significant for major depression as well as generalized anxiety disorder who presented as a walk-in patient to the behavioral health hospital on 06/15/2019.  The patient stated that she had been under stress recently, and was feeling more depressed, more anxious and thinking about killing herself.  Principal Problem: <principal problem not specified> Diagnosis: Active Problems:   MDD (major depressive disorder), recurrent severe, without psychosis (HCC)  Total Time spent with patient: 15 minutes  Past Psychiatric History: See admission H&P  Past Medical History:  Past Medical History:  Diagnosis Date  . Anemia   . Nocturnal enuresis    hx seen by Dr. Isabel CapriceGrapey in the past  . OM (otitis media)     Past Surgical History:  Procedure Laterality Date  . ADENOIDECTOMY    . TONSILLECTOMY     Family History:  Family History  Problem Relation Age of Onset  . Hypertension Mother   . Other Mother        abn ekg but nl echo and ht rx  .  Allergy (severe) Other   . Other Other        enlarged heart maternal uncle died in sleep age 22  . Diabetes type II Other    Family Psychiatric  History: See admission H&P Social History:  Social History   Substance and Sexual Activity  Alcohol Use No     Social History   Substance and Sexual Activity  Drug Use No    Social History   Socioeconomic History  . Marital status: Single    Spouse name: Not on file  . Number of children: Not on file  . Years of education: Not on file  . Highest education level: Not on file  Occupational History  . Not on file  Social Needs  . Financial resource strain: Not on file  . Food insecurity    Worry: Not on file    Inability: Not on file  . Transportation needs    Medical: Not on file    Non-medical: Not on file  Tobacco Use  . Smoking status: Current Some Day Smoker  . Smokeless tobacco: Current User  Substance and Sexual Activity  . Alcohol use: No  . Drug use: No  . Sexual activity: Yes    Partners: Male    Birth control/protection: Condom    Comment: condoms everytime  Lifestyle  . Physical activity    Days per week: Not on file    Minutes per session: Not on file  . Stress: Not on file  Relationships  . Social  connections    Talks on phone: Not on file    Gets together: Not on file    Attends religious service: Not on file    Active member of club or organization: Not on file    Attends meetings of clubs or organizations: Not on file    Relationship status: Not on file  Other Topics Concern  . 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    [employer] 11th [employer]  Good grades    No school concerns   caffiene/ tea all the time and sodas . No milk.      Born [place]   Additional Social History:    Pain Medications: See MARs Prescriptions: See MARs Over the Counter: See MARs History of alcohol / drug use?: No history of alcohol / drug abuse                     Sleep: Good  Appetite:  Good  Current Medications: Current Facility-Administered Medications  Medication Dose Route Frequency Provider Last Rate Last Dose  . acetaminophen (TYLENOL) tablet 650 mg  650 mg Oral Q6H PRN Money, Gerlene Burdockravis B, FNP      . alum & mag hydroxide-simeth (MAALOX/MYLANTA) 200-200-20 MG/5ML suspension 30 mL  30 mL Oral Q4H PRN Money, Gerlene Burdockravis B, FNP      . hydrOXYzine (ATARAX/VISTARIL) tablet 10 mg  10 mg Oral TID PRN Antonieta Pertlary, Greg Lawson, MD      . magnesium hydroxide (MILK OF MAGNESIA) suspension 30 mL  30 mL Oral Daily PRN Money, Gerlene Burdockravis B, FNP      . sertraline (ZOLOFT) tablet 25 mg  25 mg Oral Daily Antonieta Pertlary, Greg Lawson, MD   25 mg at 06/16/19 [id number]  . traZODone (DESYREL) tablet 25 mg  25 mg Oral QHS PRN Antonieta Pertlary, Greg Lawson, MD        Lab Results:  Results for orders placed or performed during the hospital encounter of 06/15/19 (from the past 48 hour(s))  SARS Coronavirus 2 (CEPHEID - Performed [facility]), Hosp Order     Status: None   Collection Time: 06/15/19  8:39 AM   Specimen: Nasopharyngeal Swab  Result Value Ref Range   SARS Coronavirus 2 NEGATIVE NEGATIVE    Comment: (NOTE) If result is NEGATIVE SARS-CoV-2 target nucleic acids are NOT DETECTED. The SARS-CoV-2 RNA is generally detectable in upper and lower  respiratory specimens during the acute phase of infection. The lowest  concentration of SARS-CoV-2 viral copies this assay can detect is 250  copies / mL. A negative result does not preclude SARS-CoV-2 infection  and should not be used as the sole basis for treatment or other  patient management decisions.  A negative result may occur with  improper specimen collection / handling, submission of specimen other  than nasopharyngeal swab, presence of viral mutation(s) within the  areas targeted by this assay, and inadequate number of viral copies  (<250 copies / mL). A negative result must be combined with clinical  observations, patient  history, and epidemiological information. If result is POSITIVE SARS-CoV-2 target nucleic acids are DETECTED. The SARS-CoV-2 RNA is generally detectable in upper and lower  respiratory specimens dur ing the acute phase of infection.  Positive  results are indicative of active infection with SARS-CoV-2.  Clinical  correlation with patient history and other diagnostic information is  necessary to determine patient infection  status.  Positive results do  not rule out bacterial infection or co-infection with other viruses. If result is PRESUMPTIVE POSTIVE SARS-CoV-2 nucleic acids MAY BE PRESENT.   A presumptive positive result was obtained on the submitted specimen  and confirmed on repeat testing.  While 2019 novel coronavirus  (SARS-CoV-2) nucleic acids may be present in the submitted sample  additional confirmatory testing may be necessary for epidemiological  and / or clinical management purposes  to differentiate between  SARS-CoV-2 and other Sarbecovirus currently known to infect humans.  If clinically indicated additional testing with an alternate test  methodology [phone]) is advised. The SARS-CoV-2 RNA is generally  detectable in upper and lower respiratory sp ecimens during the acute  phase of infection. The expected result is Negative. Fact Sheet for Patients:  BoilerBrush.com.cy Fact Sheet for Healthcare Providers: https://pope.com/ This test is not yet approved or cleared by the [place] FDA and has been authorized for detection and/or diagnosis of SARS-CoV-2 by FDA under an Emergency Use Authorization (EUA).  This EUA will remain in effect (meaning this test can be used) for the duration of the COVID-19 declaration under Section 564(b)(1) of the Act, 21 U.S.C. section 360bbb-3(b)(1), unless the authorization is terminated or revoked sooner. Performed at [facility], [address] [place],  [place] [address]   Pregnancy, urine     Status: None   Collection Time: 06/15/19 11:08 AM  Result Value Ref Range   Preg Test, Ur NEGATIVE NEGATIVE    Comment:        THE SENSITIVITY OF THIS METHODOLOGY IS >20 mIU/mL. Performed at [facility], [address] [place], [place] [address]   Urine rapid drug screen (hosp performed)not at [facility]     Status: None   Collection Time: 06/15/19 11:08 AM  Result Value Ref Range   Opiates NONE DETECTED NONE DETECTED   Cocaine NONE DETECTED NONE DETECTED   Benzodiazepines NONE DETECTED NONE DETECTED   Amphetamines NONE DETECTED NONE DETECTED   Tetrahydrocannabinol NONE DETECTED NONE DETECTED   Barbiturates NONE DETECTED NONE DETECTED    Comment: (NOTE) DRUG SCREEN FOR MEDICAL PURPOSES ONLY.  IF CONFIRMATION IS NEEDED FOR ANY PURPOSE, NOTIFY LAB WITHIN 5 DAYS. LOWEST DETECTABLE LIMITS FOR URINE DRUG SCREEN Drug Class                     Cutoff (ng/mL) Amphetamine and metabolites    1000 Barbiturate and metabolites    200 Benzodiazepine                 200 Tricyclics and metabolites     300 Opiates and metabolites        300 Cocaine and metabolites        300 THC                            50 Performed at [facility], [address], [place], [place] [address]   CBC     Status: Abnormal   Collection Time: 06/15/19  6:30 PM  Result Value Ref Range   WBC 7.7 4.0 - 10.5 K/uL   RBC 5.52 (H) 3.87 - 5.11 MIL/uL   Hemoglobin 13.0 12.0 - 15.0 g/dL   HCT 78.4 69.6 - 29.5 %   MCV 77.2 (L) 80.0 - 100.0 fL   MCH 23.6 (L) 26.0 - 34.0 pg   MCHC 30.5 30.0 - 36.0 g/dL  RDW 13.8 11.5 - 15.5 %   Platelets 245 150 - 400 K/uL   nRBC 0.0 0.0 - 0.2 %    Comment: Performed at [facility], [place] [address], [place], [place] [address]  Comprehensive metabolic panel     Status: Abnormal   Collection Time: 06/15/19  6:30 PM  Result Value Ref Range   Sodium 142 135 - 145 mmol/L   Potassium 3.7 3.5 - 5.1  mmol/L   Chloride 107 98 - 111 mmol/L   CO2 22 22 - 32 mmol/L   Glucose, Bld 121 (H) 70 - 99 mg/dL   BUN 13 6 - 20 mg/dL   Creatinine, Ser 0.86 0.44 - 1.00 mg/dL   Calcium 9.2 8.9 - 10.3 mg/dL   Total Protein 7.5 6.5 - 8.1 g/dL   Albumin 4.3 3.5 - 5.0 g/dL   AST 28 15 - 41 U/L   ALT 33 0 - 44 U/L   Alkaline Phosphatase 72 38 - 126 U/L   Total Bilirubin 0.3 0.3 - 1.2 mg/dL   GFR calc non Af Amer >60 >60 mL/min   GFR calc Af Amer >60 >60 mL/min   Anion gap 13 5 - 15    Comment: Performed at [facility], [place] [address], [place], [place] [address]  TSH     Status: None   Collection Time: 06/15/19  6:30 PM  Result Value Ref Range   TSH 1.788 0.350 - 4.500 uIU/mL    Comment: Performed by a 3rd Generation assay with a functional sensitivity of <=0.01 uIU/mL. Performed at [facility], [place] [address], [place], [place] [address]   Hemoglobin A1c     Status: Abnormal   Collection Time: 06/15/19  6:30 PM  Result Value Ref Range   Hgb A1c MFr Bld 5.8 (H) 4.8 - 5.6 %    Comment: (NOTE) Pre diabetes:          5.7%-6.4% Diabetes:              >6.4% Glycemic control for   <7.0% adults with diabetes    Mean Plasma Glucose 119.76 mg/dL    Comment: Performed at [place] [address] [place], [place] [address]    Blood Alcohol level:  No results found for: [facility]  Metabolic Disorder Labs: Lab Results  Component Value Date   HGBA1C 5.8 (H) 06/15/2019   MPG 119.76 06/15/2019   Lab Results  Component Value Date   PROLACTIN 12.8 04/08/2014   No results found for: CHOL, TRIG, HDL, CHOLHDL, VLDL, LDLCALC  Physical Findings: AIMS: Facial and Oral Movements Muscles of Facial Expression: None, normal Lips and Perioral Area: None, normal Jaw: None, normal Tongue: None, normal,Extremity Movements Upper (arms, wrists, hands, fingers): None, normal Lower (legs, knees, ankles, toes): None, normal, Trunk Movements Neck, shoulders, hips:  None, normal, Overall Severity Severity of abnormal movements (highest score from questions above): None, normal Incapacitation due to abnormal movements: None, normal Patient's awareness of abnormal movements (rate only patient's report): No Awareness, Dental Status Current problems with teeth and/or dentures?: No Does patient usually wear dentures?: No  CIWA:    COWS:     Musculoskeletal: Strength & Muscle Tone: within normal limits Gait & Station: normal Patient leans: N/A  Psychiatric Specialty Exam: Physical Exam  Nursing note and vitals reviewed. Constitutional: She is oriented to person, place, and time. She appears well-developed and well-nourished.  Cardiovascular: Normal rate.  Respiratory: Effort normal.  Neurological: She  is alert and oriented to person, place, and time.    Review of Systems  Constitutional: Negative.   Neurological: Positive for headaches. Negative for dizziness.  Psychiatric/Behavioral: Positive for depression (improving). Negative for hallucinations, substance abuse and suicidal ideas. The patient is not nervous/anxious and does not have insomnia.     Blood pressure 115/72, pulse 88, temperature 98.1 F (36.7 C), temperature source Oral, resp. rate 16, height 5\' 2"  (1.575 m), weight 65.8 kg, SpO2 100 %.Body mass index is 26.52 kg/m.  General Appearance: Casual  Eye Contact:  Good  Speech:  Clear and Coherent and Normal Rate  Volume:  Normal  Mood:  Euthymic  Affect:  Appropriate and Congruent  Thought Process:  Coherent  Orientation:  Full (Time, Place, and Person)  Thought Content:  Logical  Suicidal Thoughts:  No  Homicidal Thoughts:  No  Memory:  Immediate;   Good Recent;   Good  Judgement:  Intact  Insight:  Good  Psychomotor Activity:  Normal  Concentration:  Concentration: Good and Attention Span: Good  Recall:  Good  Fund of Knowledge:  Good  Language:  Good  Akathisia:  No  Handed:  Right  AIMS (if indicated):     Assets:   Communication Skills Desire for Improvement Financial Resources/Insurance Housing Resilience Social Support Vocational/Educational  ADL's:  Intact  Cognition:  WNL  Sleep:  Number of Hours: 6.75     Treatment Plan Summary: Daily contact with patient to assess and evaluate symptoms and progress in treatment and Medication management   Continue inpatient hospitalization.  Continue Zoloft 25 mg PO daily for depression/anxiety Continue Vistaril 10 mg PO TID PRN anxiety Continue trazodone 50 mg PO QHS PRN insomnia  Patient will participate in the therapeutic group milieu.  Discharge disposition in progress.   Aldean BakerJanet E Ava Deguire, NP 06/16/2019, 11:10 AM

## 2019-06-16 NOTE — Plan of Care (Signed)
D: Pt alert and oriented on the unit. Pt engaging with RN staff and other pts. Pt denies SI/HI, A/VH. Pt has been in the day room watching television and talking on the phone this morning. Pt rated her depression, anxiety, and feelings of hopelessness all a 0, with 10 being the worst. Pt's goal for the day is "Being able to talk to others and open up more." Pt is pleasant and cooperative.  A: Education, support and encouragement provided, q15 minute safety checks remain in effect. Medications administered per MD orders.  R: No reactions/side effects to medicine noted. Pt denies any concerns at this time, and verbally contracts for safety. Pt ambulating on the unit with no issues. Pt remains safe on and off the unit.   Problem: Education: Goal: Mental status will improve Outcome: Progressing   Problem: Coping: Goal: Ability to demonstrate self-control will improve Outcome: Progressing   Problem: Safety: Goal: Periods of time without injury will increase Outcome: Progressing   Problem: Medication: Goal: Compliance with prescribed medication regimen will improve Outcome: Progressing

## 2019-06-16 NOTE — Progress Notes (Signed)
Patient ID: April Montes, female   DOB: 11/09/1996, 23 y.o.   MRN: [id number]   [place] NOVEL CORONAVIRUS (COVID-19) DAILY CHECK-OFF SYMPTOMS - answer yes or no to each - every day NO YES  Have you had a fever in the past 24 hours?  . Fever (Temp > 37.80C / 100F) X   Have you had any of these symptoms in the past 24 hours? . New Cough .  Sore Throat  .  Shortness of Breath .  Difficulty Breathing .  Unexplained Body Aches   X   Have you had any one of these symptoms in the past 24 hours not related to allergies?   . Runny Nose .  Nasal Congestion .  Sneezing   X   If you have had runny nose, nasal congestion, sneezing in the past 24 hours, has it worsened?  X   EXPOSURES - check yes or no X   Have you traveled outside the state in the past 14 days?  X   Have you been in contact with someone with a confirmed diagnosis of COVID-19 or PUI in the past 14 days without wearing appropriate PPE?  X   Have you been living in the same home as a person with confirmed diagnosis of COVID-19 or a PUI (household contact)?    X   Have you been diagnosed with COVID-19?    X              What to do next: Answered NO to all: Answered YES to anything:   Proceed with unit schedule Follow the [facility] Inpatient Flowsheet.

## 2019-06-16 NOTE — Tx Team (Signed)
Interdisciplinary Treatment and Diagnostic Plan Update  06/16/2019 Time of Session:  April Montes MRN: [id number]  Principal Diagnosis: <principal problem not specified>  Secondary Diagnoses: Active Problems:   MDD (major depressive disorder), recurrent severe, without psychosis ([place])   Current Medications:  Current Facility-Administered Medications  Medication Dose Route Frequency Provider Last Rate Last Dose  . acetaminophen (TYLENOL) tablet 650 mg  650 mg Oral Q6H PRN Money, Lowry Ram, FNP      . alum & mag hydroxide-simeth (MAALOX/MYLANTA) 200-200-20 MG/5ML suspension 30 mL  30 mL Oral Q4H PRN Money, Lowry Ram, FNP      . hydrOXYzine (ATARAX/VISTARIL) tablet 10 mg  10 mg Oral TID PRN Sharma Covert, MD      . magnesium hydroxide (MILK OF MAGNESIA) suspension 30 mL  30 mL Oral Daily PRN Money, Lowry Ram, FNP      . sertraline (ZOLOFT) tablet 25 mg  25 mg Oral Daily Sharma Covert, MD   25 mg at 06/16/19 [id number]  . traZODone (DESYREL) tablet 25 mg  25 mg Oral QHS PRN Sharma Covert, MD       PTA Medications: No medications prior to admission.    Patient Stressors: Legal issue Other: ineffective coping  Patient Strengths: Average or above average intelligence General fund of knowledge Motivation for treatment/growth Physical Health  Treatment Modalities: Medication Management, Group therapy, Case management,  1 to 1 session with clinician, Psychoeducation, Recreational therapy.   Physician Treatment Plan for Primary Diagnosis: <principal problem not specified> Long Term Goal(s): Improvement in symptoms so as ready for discharge Improvement in symptoms so as ready for discharge   Short Term Goals: Ability to identify changes in lifestyle to reduce recurrence of condition will improve Ability to verbalize feelings will improve Ability to disclose and discuss suicidal ideas Ability to demonstrate self-control will improve Ability to identify and develop  effective coping behaviors will improve Ability to maintain clinical measurements within normal limits will improve Ability to identify changes in lifestyle to reduce recurrence of condition will improve Ability to verbalize feelings will improve Ability to disclose and discuss suicidal ideas Ability to demonstrate self-control will improve Ability to identify and develop effective coping behaviors will improve Ability to maintain clinical measurements within normal limits will improve  Medication Management: Evaluate patient's response, side effects, and tolerance of medication regimen.  Therapeutic Interventions: 1 to 1 sessions, Unit Group sessions and Medication administration.  Evaluation of Outcomes: Not Met  Physician Treatment Plan for Secondary Diagnosis: Active Problems:   MDD (major depressive disorder), recurrent severe, without psychosis ([place])  Long Term Goal(s): Improvement in symptoms so as ready for discharge Improvement in symptoms so as ready for discharge   Short Term Goals: Ability to identify changes in lifestyle to reduce recurrence of condition will improve Ability to verbalize feelings will improve Ability to disclose and discuss suicidal ideas Ability to demonstrate self-control will improve Ability to identify and develop effective coping behaviors will improve Ability to maintain clinical measurements within normal limits will improve Ability to identify changes in lifestyle to reduce recurrence of condition will improve Ability to verbalize feelings will improve Ability to disclose and discuss suicidal ideas Ability to demonstrate self-control will improve Ability to identify and develop effective coping behaviors will improve Ability to maintain clinical measurements within normal limits will improve     Medication Management: Evaluate patient's response, side effects, and tolerance of medication regimen.  Therapeutic Interventions: 1 to 1 sessions,  Unit Group sessions and Medication  administration.  Evaluation of Outcomes: Not Met   RN Treatment Plan for Primary Diagnosis: <principal problem not specified> Long Term Goal(s): Knowledge of disease and therapeutic regimen to maintain health will improve  Short Term Goals: Ability to participate in decision making will improve, Ability to verbalize feelings will improve, Ability to disclose and discuss suicidal ideas, Ability to identify and develop effective coping behaviors will improve and Compliance with prescribed medications will improve  Medication Management: RN will administer medications as ordered by provider, will assess and evaluate patient's response and provide education to patient for prescribed medication. RN will report any adverse and/or side effects to prescribing provider.  Therapeutic Interventions: 1 on 1 counseling sessions, Psychoeducation, Medication administration, Evaluate responses to treatment, Monitor vital signs and CBGs as ordered, Perform/monitor CIWA, COWS, AIMS and Fall Risk screenings as ordered, Perform wound care treatments as ordered.  Evaluation of Outcomes: Not Met   LCSW Treatment Plan for Primary Diagnosis: <principal problem not specified> Long Term Goal(s): Safe transition to appropriate next level of care at discharge, Engage patient in therapeutic group addressing interpersonal concerns.  Short Term Goals: Engage patient in aftercare planning with referrals and resources  Therapeutic Interventions: Assess for all discharge needs, 1 to 1 time with Social worker, Explore available resources and support systems, Assess for adequacy in community support network, Educate family and significant other(s) on suicide prevention, Complete Psychosocial Assessment, Interpersonal group therapy.  Evaluation of Outcomes: Not Met   Progress in Treatment: Attending groups: Yes. Participating in groups: Yes. Taking medication as prescribed:  Yes. Toleration medication: Yes. Family/Significant other contact made: No, will contact:  if patient consents for collateral contacts Patient understands diagnosis: Yes. Discussing patient identified problems/goals with staff: Yes. Medical problems stabilized or resolved: Yes. Denies suicidal/homicidal ideation: Yes. Issues/concerns per patient self-inventory: No. Other:   New problem(s) identified:None   New Short Term/Long Term Goal(s): medication stabilization, elimination of SI thoughts, development of comprehensive mental wellness plan.    Patient Goals:    Discharge Plan or Barriers: Patient recently admitted. CSW will continue to follow and assess for appropriate referrals and possible discharge planning.    Reason for Continuation of Hospitalization: Anxiety Depression Medication stabilization Suicidal ideation  Estimated Length of Stay: 3-5 days   Attendees: Patient: 06/16/2019 8:44 AM  Physician: Dr. Myles Lipps, MD 06/16/2019 8:44 AM  Nursing: Selinda Eon Mandan  06/16/2019 8:44 AM  RN Care Manager: 06/16/2019 8:44 AM  Social Worker: Radonna Ricker, [place] 06/16/2019 8:44 AM  Recreational Therapist:  06/16/2019 8:44 AM  Other:  06/16/2019 8:44 AM  Other:  06/16/2019 8:44 AM  Other: 06/16/2019 8:44 AM    Scribe for Treatment Team: Marylee Floras, [place] 06/16/2019 8:44 AM
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
## 2019-06-17 MED ORDER — SERTRALINE HCL 50 MG PO TABS: 50.0000 mg | ORAL_TABLET | Freq: Every day | ORAL | 0 refills | Status: DC

## 2019-06-17 MED ORDER — SERTRALINE HCL 50 MG PO TABS
50.0000 mg | ORAL_TABLET | Freq: Every day | ORAL | Status: DC
  Filled 2019-06-17 (×2): qty 1

## 2019-06-17 NOTE — BHH Suicide Risk Assessment (Signed)
[facility] Discharge Suicide Risk Assessment   Principal Problem: depression Discharge Diagnoses: Active Problems:   MDD (major depressive disorder), recurrent severe, without psychosis ([place])   Total Time spent with patient: 30 minutes  Musculoskeletal: Strength & Muscle Tone: within normal limits Gait & Station: normal Patient leans: N/A  Psychiatric Specialty Exam: ROS denies chest pain, no shortness of breath, no vomiting, no fever  Blood pressure (!) 124/93, pulse 100, temperature 97.8 F (36.6 C), temperature source Oral, resp. rate 16, height 5\' 2"  (1.575 m), weight 65.8 kg, SpO2 100 %.Body mass index is 26.52 kg/m.  General Appearance: Well Groomed  Eye Contact::  Good  Speech:  Normal Rate409  Volume:  Normal  Mood:  reports mood is improved and presents euthymic  Affect:  appropriate and full range   Thought Process:  Linear and Descriptions of Associations: Intact  Orientation:  Full (Time, Place, and Person)  Thought Content:  no hallucinations, no delusions , does not appear internally preoccupied   Suicidal Thoughts:  No denies suicidal or self injurious ideations, denies any homicidal or violent ideations  Homicidal Thoughts:  No  Memory:  recent and remote grossly intact   Judgement:  Other:  improving   Insight:  improving   Psychomotor Activity:  Normal   Concentration:  Good  Recall:  Good  Fund of Knowledge:Good  Language: Good  Akathisia:  Negative  Handed:  Right  AIMS (if indicated):     Assets:  Desire for Improvement Social Support  Sleep:  Number of Hours: 6.75  Cognition: WNL  ADL's:  Intact   Mental Status Per Nursing Assessment::   On Admission:  Suicidal ideation indicated by patient, Suicide plan, Self-harm thoughts, Self-harm behaviors  Demographic Factors:  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 , Electronics engineer, lives with parents  Loss Factors: Does not endorse specific stressors  Historical Factors: No prior psychiatric admissions, prior suicide  attempt as a teenager  Risk Reduction Factors:   Sense of responsibility to family, Living with another person, especially a relative, Positive social support and Positive coping skills or problem solving skills  Continued Clinical Symptoms:  At this time patient is alert, attentive, calm, mood described as improved and presents euthymic, with a full range of affect, no thought disorder, no suicidal or self injurious ideations, no homicidal or violent ideations, no hallucinations, no delusions , future oriented . Behavior on unit in good control, visible in day room, pleasant on approach. With her express consent I spoke with her father and mother via phone - they corroborate patient presents much improved and are in agreement with discharge today. Denies significant  medication side effects- we have discussed side effect profile and potential risk of increased suicidal ideations in young adults early in treatment with antidepressants.  Will increase Zoloft to 50 mgrs QDAY .  Cognitive Features That Contribute To Risk:  No gross cognitive deficits noted upon discharge. Is alert , attentive, and oriented x 3   Suicide Risk:  Mild:  Suicidal ideation of limited frequency, intensity, duration, and specificity.  There are no identifiable plans, no associated intent, mild dysphoria and related symptoms, good self-control (both objective and subjective assessment), few other risk factors, and identifiable protective factors, including available and accessible social support.  Follow-up Information    [facility] Follow up on 06/26/2019.   Specialty: Behavioral Health Why: Therapy appointment with Rollene Fare is Thursday, 7/16 at 3:00p.  Medication management appointment with Dr. Montel Culver is Tuesday, 7/21 at 11:00a.  Appointments are going to be done virtually through WebEx. An email will be sent to you with a link   Contact information: [address] [place] [id number] [phone]          Plan Of Care/Follow-up recommendations:  Activity:  as tolerated Diet:  regular  Tests:  NA Other:  See below  Patient is requesting discharge and has submitted letter requesting to be discharged- no grounds for involuntary commitment at this time. Leaving unit in good spirits . Plans to return home. Plans to follow up as above.   Craige CottaFernando A , MD 06/17/2019, 9:14 AM

## 2019-06-17 NOTE — Discharge Summary (Addendum)
Physician Discharge Summary Note  Patient:  April Montes is an 23 y.o., female MRN:  [id number] DOB:  27-Nov-1996 Patient phone:  [phone] (home)  Patient address:   [address] [place] [id number],  Total Time spent with patient: 15 minutes  Date of Admission:  06/15/2019 Date of Discharge: 06/17/19  Reason for Admission:  suicidal ideation  Principal Problem: <principal problem not specified> Discharge Diagnoses: Active Problems:   MDD (major depressive disorder), recurrent severe, without psychosis ([facility])   Past Psychiatric History: Patient denied any previous psychiatric evaluations, psychiatric treatment, or psychiatric admissions to the hospital.  Past Medical History:  Past Medical History:  Diagnosis Date  . Anemia   . Nocturnal enuresis    hx seen by April Montes in the past  . OM (otitis media)     Past Surgical History:  Procedure Laterality Date  . ADENOIDECTOMY    . TONSILLECTOMY     Family History:  Family History  Problem Relation Age of Onset  . Hypertension Mother   . Other Mother        abn ekg but nl echo and ht rx  . Allergy (severe) Other   . Other Other        enlarged heart maternal uncle died in sleep age 22  . Diabetes type II Other    Family Psychiatric  History: Bipolar disorder in both sides of her family. Social History:  Social History   Substance and Sexual Activity  Alcohol Use No     Social History   Substance and Sexual Activity  Drug Use No    Social History   Socioeconomic History  . Marital status: Single    Spouse name: Not on file  . Number of children: Not on file  . Years of education: Not on file  . Highest education level: Not on file  Occupational History  . Not on file  Social Needs  . Financial resource strain: Not on file  . Food insecurity    Worry: Not on file    Inability: Not on file  . Transportation needs    Medical: Not on file    Non-medical: Not on file  Tobacco Use  .  Smoking status: Current Some Day Smoker  . Smokeless tobacco: Current User  Substance and Sexual Activity  . Alcohol use: No  . Drug use: No  . Sexual activity: Yes    Partners: Male    Birth control/protection: Condom    Comment: condoms everytime  Lifestyle  . Physical activity    Days per week: Not on file    Minutes per session: Not on file  . Stress: Not on file  Relationships  . Social Musicianconnections    Talks on phone: Not on file    Gets together: Not on file    Attends religious service: Not on file    Active member of club or organization: Not on file    Attends meetings of clubs or organizations: Not on file    Relationship status: Not on file  Other Topics Concern  . 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    [employer] 11th [employer]  Good grades    No school concerns   caffiene/ tea all the time and sodas . No milk.      [facility]    Hospital Course:  From admission H&P: Patient is  a 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 with a probable past psychiatric history significant for major depression as well as generalized anxiety disorder who presented as a walk-in patient to the behavioral health hospital on 06/15/2019. The patient stated that she had been under stress recently, and was feeling more depressed, more anxious and thinking about killing herself. She really did not cite any specific issues that led to the increased stress. She stated that she had been depressed and anxious for many years. She had overdosed as an adolescent, and discussed it with her family after the fact, but the patient never saw psychiatry, counseling or anything else with regard to behavioral treatment. She denied any previous psychiatric medications. She denied any physical, emotional or sexual trauma in the past. She has a family history significant for bipolar disorder, but denied any episodes of excessive spending, increased goal-directed activity, or euphoria.  She also stated that she had "heard voices", but these seem to be inside her head versus hallucinations. She stated that she already felt better being here, and being able to talk about things in an open way. She denied current suicidal ideation.  April Montes was admitted for depression and anxiety with suicidal ideation. She remained on the [facility] unit for two days. She had initially reported "hearing voices" but on further assessment it was clarified that these were bad thoughts in her head rather than auditory hallucinations. She was started on Zoloft. She participated in group therapy on the unit. She responded well to treatment with no adverse effects reported. She has shown improved mood, affect, sleep, and interaction. She denies any SI/HI/AVH and contracts for safety. Collateral information was obtained from her mother, who denies safety concerns for discharge. She agrees to follow up at [facility] (see below). Patient is provided with prescriptions for medications upon discharge. Her mother is picking her up for discharge home.  Physical Findings: AIMS: Facial and Oral Movements Muscles of Facial Expression: None, normal Lips and Perioral Area: None, normal Jaw: None, normal Tongue: None, normal,Extremity Movements Upper (arms, wrists, hands, fingers): None, normal Lower (legs, knees, ankles, toes): None, normal, Trunk Movements Neck, shoulders, hips: None, normal, Overall Severity Severity of abnormal movements (highest score from questions above): None, normal Incapacitation due to abnormal movements: None, normal Patient's awareness of abnormal movements (rate only patient's report): No Awareness, Dental Status Current problems with teeth and/or dentures?: No Does patient usually wear dentures?: No  CIWA:    COWS:     Musculoskeletal: Strength & Muscle Tone: within normal limits Gait & Station: normal Patient leans: N/A  Psychiatric Specialty Exam: Physical  Exam  Nursing note and vitals reviewed. Constitutional: She is oriented to person, place, and time. She appears well-developed and well-nourished.  Cardiovascular: Normal rate.  Respiratory: Effort normal.  Neurological: She is alert and oriented to person, place, and time.    Review of Systems  Constitutional: Negative.   Respiratory: Negative for cough and shortness of breath.   Cardiovascular: Negative for chest pain.  Gastrointestinal: Negative for nausea and vomiting.  Neurological: Negative for headaches.  Psychiatric/Behavioral: Positive for depression (stable on medication). Negative for hallucinations, substance abuse and suicidal ideas. The patient is not nervous/anxious and does not have insomnia.     Blood pressure (!) 124/93, pulse 100, temperature 97.8 F (36.6 C), temperature source Oral, resp. rate 16, height 5\' 2"  (1.575 m), weight 65.8 kg, SpO2 100 %.Body mass index is 26.52 kg/m.  See MD's discharge SRA     Have you used any  form of tobacco in the last 30 days? (Cigarettes, Smokeless Tobacco, Cigars, and/or Pipes): No  Has this patient used any form of tobacco in the last 30 days? (Cigarettes, Smokeless Tobacco, Cigars, and/or Pipes)  No  Blood Alcohol level:  No results found for: [facility]  Metabolic Disorder Labs:  Lab Results  Component Value Date   HGBA1C 5.8 (H) 06/15/2019   MPG 119.76 06/15/2019   Lab Results  Component Value Date   PROLACTIN 12.8 04/08/2014   No results found for: CHOL, TRIG, HDL, CHOLHDL, VLDL, LDLCALC  See Psychiatric Specialty Exam and Suicide Risk Assessment completed by Attending Physician prior to discharge.  Discharge destination:  Home  Is patient on multiple antipsychotic therapies at discharge:  No   Has Patient had three or more failed trials of antipsychotic monotherapy by history:  No  Recommended Plan for Multiple Antipsychotic Therapies: NA  Discharge Instructions    Discharge instructions   Complete by: As  directed    Patient is instructed to take all prescribed medications as recommended. Report any side effects or adverse reactions to your outpatient psychiatrist. Patient is instructed to abstain from alcohol and illegal drugs while on prescription medications. In the event of worsening symptoms, patient is instructed to call the crisis hotline, 911, or go to the nearest emergency department for evaluation and treatment.     Allergies as of 06/17/2019   No Known Allergies     Medication List    TAKE these medications     Indication  sertraline 50 MG tablet Commonly known as: ZOLOFT Take 1 tablet (50 mg total) by mouth daily. Start taking on: June 18, 2019  Indication: Major Depressive Disorder      Follow-up Information    [facility] Follow up on 06/26/2019.   Specialty: Behavioral Health Why: Therapy appointment with Rene KocherRegina is Thursday, 7/16 at 3:00p.  Medication management appointment with Dr. Hinton DyerPucilowski is Tuesday, 7/21 at 11:00a.  Appointments are going to be done virtually through WebEx. An email will be sent to you with a link   Contact information: [address] [place] [id number] [phone]          Follow-up recommendations: Activity as tolerated. Diet as recommended by primary care physician. Keep all scheduled follow-up appointments as recommended.   Comments:   Patient is instructed to take all prescribed medications as recommended. Report any side effects or adverse reactions to your outpatient psychiatrist. Patient is instructed to abstain from alcohol and illegal drugs while on prescription medications. In the event of worsening symptoms, patient is instructed to call the crisis hotline, 911, or go to the nearest emergency department for evaluation and treatment.  Signed: Aldean BakerJanet E Sykes, NP 06/17/2019, 9:51 AM   Patient seen, Suicide Assessment Completed.  Disposition Plan Reviewed

## 2019-06-17 NOTE — BHH Suicide Risk Assessment (Signed)
B[facility] INPATIENT:  Family/Significant Other Suicide Prevention Education  Suicide Prevention Education:  Education Completed; Pt's mother, Katarina Riebe, has been identified by the patient as the family member/significant other with whom the patient will be residing, and identified as the person(s) who will aid the patient in the event of a mental health crisis (suicidal ideations/suicide attempt).  With written consent from the patient, the family member/significant other has been provided the following suicide prevention education, prior to the and/or following the discharge of the patient.  The suicide prevention education provided includes the following:  Suicide risk factors  Suicide prevention and interventions  National Suicide Hotline telephone number  [facility] assessment telephone number  [facility] Emergency Assistance [place] and/or Residential Mobile Crisis Unit telephone number  Request made of family/significant other to:  Remove weapons (e.g., guns, rifles, knives), all items previously/currently identified as safety concern.    Remove drugs/medications (over-the-counter, prescriptions, illicit drugs), all items previously/currently identified as a safety concern.  The family member/significant other verbalizes understanding of the suicide prevention education information provided.  The family member/significant other agrees to remove the items of safety concern listed above.  CSW contacted pt's mother, Salinda Snedeker. Pt's mother stated that she does not have any questions or concerns. Pt's mother states that she just wants to know that the pt has aftercare set up.    Trecia Rogers 06/17/2019, 10:10 AM

## 2019-06-17 NOTE — Progress Notes (Signed)
Discharge:  Patient discharged home with family member.  Patient denied SI and HI.  Denied A/V hallucinations.  Denied pain.  Suicide prevention information given and discussed with patient who stated she understood and had no questions.  Patient stated she received all her belongings, clothing, toiletries, misc items, etc.  All required discharge information given to patient at discharge.  Patient stated she appreciated all assistance received from [facility] staff.

## 2019-06-17 NOTE — Progress Notes (Signed)
Spiritual care group on grief and loss facilitated by chaplain Jerene Pitch  Group Goal:  Support / Education around grief and loss Members engage in facilitated group support and psycho-social education.  Group Description:  Following introductions and group rules, group members engaged in facilitated group dialog and support around topic of loss, with particular support around experiences of loss in their lives. Group Identified types of loss (relationships / self / things) and identified patterns, circumstances, and changes that precipitate losses. Reflected on thoughts / feelings around loss, normalized grief responses, and recognized variety in grief experience. Patient Progress:  Present throughout group.  Attentive to group conversation.  Did not engage in group discussion.

## 2019-06-17 NOTE — Progress Notes (Signed)
D:  Patient's self inventory sheet, patient sleeps good, no sleep medication.  Good appetite, high energy level, good concentration.  Denied anxiety, depression and hopeless.  Denied withdrawals  Denied SI.  Denied physical problems.  Denied physical pain.  Goal is trying to still open up more.  Stay out of my room.  Does have discharge plans. A  Medications administered per MD orders.  Emotional support and encouragements given patient.  R:  Patient denied SI and I, contracts for safety.    Denied A/V hallucinations.  Safety maintained with 15 minute checks.

## 2019-06-17 NOTE — Progress Notes (Signed)
  [facility] Adult Case Management Discharge Plan :  Will you be returning to the same living situation after discharge:  Yes,  pt's parents At discharge, do you have transportation home?: Yes,  pt's sister Do you have the ability to pay for your medications: Yes,  private insurance  Release of information consent forms completed and in the chart;  Patient's signature needed at discharge.  Patient to Follow up at: Follow-up Information    [facility] Follow up on 06/26/2019.   Specialty: Behavioral Health Why: Therapy appointment with Rollene Fare is Thursday, 7/16 at 3:00p.  Medication management appointment with Dr. Montel Culver is Tuesday, 7/21 at 11:00a.  Appointments are going to be done virtually through WebEx. An email will be sent to you with a link   Contact information: [place] [phone]          Next level of care provider has access to [facility] and Suicide Prevention discussed: Yes,  pt's mother  Have you used any form of tobacco in the last 30 days? (Cigarettes, Smokeless Tobacco, Cigars, and/or Pipes): No  Has patient been referred to the Quitline?: N/A patient is not a smoker  Patient has been referred for addiction treatment: [place], LCSW 06/17/2019, 10:36 AM

## 2019-06-17 NOTE — Progress Notes (Signed)
The focus of this group is to help patients establish daily goals to achieve during treatment and discuss how the patient can incorporate goal setting into their daily lives to aide in recovery. 

## 2019-06-26 ENCOUNTER — Other Ambulatory Visit: Payer: Self-pay

## 2019-06-26 ENCOUNTER — Ambulatory Visit (INDEPENDENT_AMBULATORY_CARE_PROVIDER_SITE_OTHER): Payer: Federal, State, Local not specified - PPO | Admitting: Licensed Clinical Social Worker

## 2019-06-26 DIAGNOSIS — F331 Major depressive disorder, recurrent, moderate: Secondary | ICD-10-CM
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
## 2019-07-01 ENCOUNTER — Other Ambulatory Visit: Payer: Self-pay

## 2019-07-01 ENCOUNTER — Encounter (HOSPITAL_COMMUNITY): Payer: Self-pay | Admitting: Psychiatry

## 2019-07-01 ENCOUNTER — Ambulatory Visit (INDEPENDENT_AMBULATORY_CARE_PROVIDER_SITE_OTHER): Payer: Federal, State, Local not specified - PPO | Admitting: Psychiatry

## 2019-07-01 DIAGNOSIS — F332 Major depressive disorder, recurrent severe without psychotic features: Secondary | ICD-10-CM | POA: Diagnosis not present

## 2019-07-01 MED ORDER — TRAZODONE HCL 50 MG PO TABS: 50.0000 mg | ORAL_TABLET | Freq: Every evening | ORAL | 0 refills | Status: DC | PRN

## 2019-07-01 MED ORDER — SERTRALINE HCL 50 MG PO TABS: 50.0000 mg | ORAL_TABLET | Freq: Every day | ORAL | 0 refills | Status: DC
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
## 2019-07-14 ENCOUNTER — Ambulatory Visit (HOSPITAL_COMMUNITY): Payer: Federal, State, Local not specified - PPO | Admitting: Psychiatry

## 2019-07-22 ENCOUNTER — Ambulatory Visit (INDEPENDENT_AMBULATORY_CARE_PROVIDER_SITE_OTHER): Payer: Federal, State, Local not specified - PPO | Admitting: Psychiatry

## 2019-07-22 ENCOUNTER — Other Ambulatory Visit: Payer: Self-pay

## 2019-07-22 DIAGNOSIS — F411 Generalized anxiety disorder: Secondary | ICD-10-CM | POA: Insufficient documentation

## 2019-07-22 DIAGNOSIS — F331 Major depressive disorder, recurrent, moderate: Secondary | ICD-10-CM | POA: Diagnosis not present

## 2019-07-22 MED ORDER — ZOLPIDEM TARTRATE 5 MG PO TABS: 5.0000 mg | ORAL_TABLET | Freq: Every evening | ORAL | 0 refills | Status: DC | PRN

## 2019-07-22 MED ORDER — SERTRALINE HCL 100 MG PO TABS: 100.0000 mg | ORAL_TABLET | Freq: Every day | ORAL | 1 refills | Status: DC

## 2019-07-22 NOTE — Progress Notes (Signed)
[place] MD/PA/NP OP Progress Note  07/22/2019 10:51 AM April Montes  MRN:  [id number] Interview was conducted by phone and I verified that I was speaking with the correct person using two identifiers. I discussed the limitations of evaluation and management by telemedicine and  the availability of in person appointments. Patient expressed understanding and agreed to proceed.  Chief Complaint: Depression , anxiety, insomnia.  HPI: 23 year old single AAF with history of depression and generalized anxiety disorder who presented as a walk-in patient to [facility] on 06/15/2019 (discharged on 06/17/19). She reported being increasingly depressed, anxious and having suicidal thoughts of overdosing on medications (none were being prescribed). She could not identify any specific issues that led to the increased stress. Vincenzina was started on sertraline in the hospital and discharged in improved condition. She noticed improvement in mood but still reports depression/anxiety at 3-4 on a 0-10 scale (0 bing no depression). She is not sleeping well (initial insomnia and s;leeps only 3-4 hours with trazodone) and feeling tired during the day. Her appetite is better and she denies having SI.She reports feeling depressed and anxious for many years and has a single episode of suicidal attempt by OD as a 23 year old. There is no hx of previous psychiatric admissions or treatment for depression. She has a hx of bipolar disorder in family (maternal aunt and one sister) but denied any episodes of excessive spending, increased goal-directed activity, or euphoria. Sue has no hx of alcohol or drug abuse. She will resume on-line classes at [employer] (Monday, Tuesday) next week.  Visit Diagnosis:    ICD-10-CM   1. Major depressive disorder, recurrent episode, moderate (HCC)  F33.1   2. GAD (generalized anxiety disorder)  F41.1     Past Psychiatric History: Please see intake H&P.  Past Medical History:  Past Medical History:   Diagnosis Date  . Anemia   . Nocturnal enuresis    hx seen by Dr. Risa Grill in the past  . OM (otitis media)     Past Surgical History:  Procedure Laterality Date  . ADENOIDECTOMY    . TONSILLECTOMY      Family Psychiatric History: Reviewed.  Family History:  Family History  Problem Relation Age of Onset  . Hypertension Mother   . Other Mother        abn ekg but nl echo and ht rx  . Bipolar disorder Sister   . Allergy (severe) Other   . Other Other        enlarged heart maternal uncle died in sleep age 35  . Diabetes type II Other   . Bipolar disorder Maternal Aunt     Social History:  Social History   Socioeconomic History  . Marital status: Single    Spouse name: Not on file  . Number of children: Not on file  . Years of education: Not on file  . Highest education level: Not on file  Occupational History  . Not on file  Social Needs  . Financial resource strain: Not on file  . Food insecurity    Worry: Not on file    Inability: Not on file  . Transportation needs    Medical: Not on file    Non-medical: Not on file  Tobacco Use  . Smoking status: Current Some Day Smoker  . Smokeless tobacco: Current User  Substance and Sexual Activity  . Alcohol use: No  . Drug use: No  . Sexual activity: Yes    Partners: Male  Birth control/protection: Condom    Comment: condoms everytime  Lifestyle  . Physical activity    Days per week: Not on file    Minutes per session: Not on file  . Stress: Not on file  Relationships  . Social Musicianconnections    Talks on phone: Not on file    Gets together: Not on file    Attends religious service: Not on file    Active member of club or organization: Not on file    Attends meetings of clubs or organizations: Not on file    Relationship status: Not on file  Other Topics Concern  . 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    [employer] 11th grade  Good grades    No school concerns    caffiene/ tea all the time and sodas . No milk.      Born April Montes    Allergies: No Known Allergies  Metabolic Disorder Labs: Lab Results  Component Value Date   HGBA1C 5.8 (H) 06/15/2019   MPG 119.76 06/15/2019   Lab Results  Component Value Date   PROLACTIN 12.8 04/08/2014   No results found for: CHOL, TRIG, HDL, CHOLHDL, VLDL, LDLCALC Lab Results  Component Value Date   TSH 1.788 06/15/2019   TSH 1.542 04/08/2014    Therapeutic Level Labs: No results found for: LITHIUM No results found for: VALPROATE No components found for:  CBMZ  Current Medications: Current Outpatient Medications  Medication Sig Dispense Refill  . sertraline (ZOLOFT) 100 MG tablet Take 1 tablet (100 mg total) by mouth at bedtime. 30 tablet 1  . zolpidem (AMBIEN) 5 MG tablet Take 1 tablet (5 mg total) by mouth at bedtime as needed for sleep. 30 tablet 0   No current facility-administered medications for this visit.      Psychiatric Specialty Exam: Review of Systems  Musculoskeletal: Positive for back pain.  Psychiatric/Behavioral: Positive for depression. The patient is nervous/anxious and has insomnia.   All other systems reviewed and are negative.   There were no vitals taken for this visit.There is no height or weight on file to calculate BMI.  General Appearance: NA  Eye Contact:  NA  Speech:  Clear and Coherent and Normal Rate  Volume:  Normal  Mood:  Anxious and Depressed  Affect:  NA  Thought Process:  Goal Directed  Orientation:  Full (Time, Place, and Person)  Thought Content: Logical   Suicidal Thoughts:  No  Homicidal Thoughts:  No  Memory:  Immediate;   Good Recent;   Good Remote;   Good  Judgement:  Good  Insight:  Fair  Psychomotor Activity:  NA  Concentration:  Concentration: Good  Recall:  Good  Fund of Knowledge: Good  Language: Good  Akathisia:  Negative  Handed:  Right  AIMS (if indicated): not done  Assets:  Communication Skills Desire for  Improvement Financial Resources/Insurance Housing Physical Health Resilience Talents/Skills  ADL's:  Intact  Cognition: WNL  Sleep:  Poor   Screenings: AIMS     [facility]  AIMS Total Score  0    AUDIT     Admission (Discharged[facility]  Alcohol Use Disorder Identification Test Final Score (AUDIT)  0    PHQ2-9     Office Visit from  03/09/[facility]  PHQ-2 Total Score  0       Assessment and Plan: 23 year old single AAF with history of depression and generalized anxiety disorder who presented as a walk-in patient [facility] on 06/15/2019 (discharged on 06/17/19). She reported being increasingly depressed, anxious and having suicidal thoughts of overdosing on medications (none were being prescribed). She could not identify any specific issues that led to the increased stress. Apolonio Schneidersshleigh was started on sertraline in the hospital and discharged in improved condition. She noticed improvement in mood but still reports depression/anxiety at 3-4 on a 0-10 scale (0 bing no depression). She is not sleeping well (initial insomnia and s;leeps only 3-4 hours with trazodone) and feeling tired during the day. Her appetite is better and she denies having SI.She reports feeling depressed and anxious for many years and has a single episode of suicidal attempt by OD as a 23 year old. There is no hx of previous psychiatric admissions or treatment for depression. She has a hx of bipolar disorder in family (maternal aunt and one sister) but denied any episodes of excessive spending, increased goal-directed activity, or euphoria. Munira has no hx of alcohol or drug abuse. She will resume on-line classes at [employer] (Monday, Tuesday) next week.  Dx: MDD recurrent moderate; GAD  Plan: Increase sertraline to 100 mg at HS, dc  trazodone and add zolpidem 5 mg prn insomnia. The plan was discussed with patient who had an opportunity to ask questions and these were all answered. I spend 25 minutes in phone consultation with the patient     Magdalene Patricialgierd A Emelina Hinch, MD 07/22/2019, 10:51 AM

## 2019-07-28 ENCOUNTER — Other Ambulatory Visit (HOSPITAL_COMMUNITY): Payer: Self-pay | Admitting: Psychiatry

## 2019-09-03 ENCOUNTER — Ambulatory Visit (INDEPENDENT_AMBULATORY_CARE_PROVIDER_SITE_OTHER): Payer: Federal, State, Local not specified - PPO | Admitting: Psychiatry

## 2019-09-03 ENCOUNTER — Other Ambulatory Visit: Payer: Self-pay

## 2019-09-03 DIAGNOSIS — F411 Generalized anxiety disorder: Secondary | ICD-10-CM

## 2019-09-03 DIAGNOSIS — F331 Major depressive disorder, recurrent, moderate: Secondary | ICD-10-CM | POA: Diagnosis not present

## 2019-09-03 MED ORDER — ZOLPIDEM TARTRATE 5 MG PO TABS: 5.0000 mg | ORAL_TABLET | Freq: Every evening | ORAL | 1 refills | Status: DC | PRN

## 2019-09-03 MED ORDER — SERTRALINE HCL 100 MG PO TABS: 150.0000 mg | ORAL_TABLET | Freq: Every day | ORAL | 0 refills | Status: DC

## 2019-09-03 NOTE — Progress Notes (Signed)
[facility] MD/PA/NP OP Progress Note  09/03/2019 10:19 AM April Montes  MRN:  [id number] Interview was conducted by phone and I verified that I was speaking with the correct person using two identifiers. I discussed the limitations of evaluation and management by telemedicine and  the availability of in person appointments. Patient expressed understanding and agreed to proceed.  Chief Complaint: Anxiety, some depression, insomnia.  HPI: 23 year oldsingle AAFwith history ofdepressionandgeneralized anxiety disorder who [facility].She reported being increasingly depressed, anxious and having suicidal thoughts of overdosing on medications (none were being prescribed).Shecould not identify anyspecific issues that led to the increased stress.April Montes was started on sertraline in the hospital and discharged in improved condition. She noticed some initial improvement in mood but as she still reported some depression/anxiety we increased the dose to 100 mg a month ago. She has not been sleeping well with trazodone so we instead started zolpidem 5 mg. She still reports feeling anxious and feels that one medication makes her "hyper" while the other tries to help her sleep. She has been taking both at bedtime and it seems that a higher dose of sertraline interferes with sleep. Her appetite is better and she denies having SI.Shereports feeling depressed and anxiousfor many yearsand has a single episode of suicidal attempt by OD as a 23 year old.  She has a hx of bipolar disorder in family (maternal aunt and one sister)but denied any episodes of excessive spending, increased goal-directed activity, or euphoria. 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   Visit Diagnosis:    ICD-10-CM   1. GAD (generalized anxiety disorder)  F41.1   2. Major depressive disorder, recurrent episode, moderate (HCC)  F33.1     Past Psychiatric History:  Please see intake H&P.  Past Medical History:  Past Medical History:  Diagnosis Date  . Anemia   . Nocturnal enuresis    hx seen by Dr. Isabel Caprice in the past  . OM (otitis media)     Past Surgical History:  Procedure Laterality Date  . ADENOIDECTOMY    . TONSILLECTOMY      Family Psychiatric History: Reviewed.  Family History:  Family History  Problem Relation Age of Onset  . Hypertension Mother   . Other Mother        abn ekg but nl echo and ht rx  . Bipolar disorder Sister   . Allergy (severe) Other   . Other Other        enlarged heart maternal uncle died in sleep age 30  . Diabetes type II Other   . Bipolar disorder Maternal Aunt     Social History:  Social History   Socioeconomic History  . Marital status: Single    Spouse name: Not on file  . Number of children: Not on file  . Years of education: Not on file  . Highest education level: Not on file  Occupational History  . Not on file  Social Needs  . Financial resource strain: Not on file  . Food insecurity    Worry: Not on file    Inability: Not on file  . Transportation needs    Medical: Not on file    Non-medical: Not on file  Tobacco Use  . Smoking status: Current Some Day Smoker  . Smokeless tobacco: Current User  Substance and Sexual Activity  . Alcohol use: No  . Drug use: No  . Sexual activity: Yes  Partners: Male    Birth control/protection: Condom    Comment: condoms everytime  Lifestyle  . Physical activity    Days per week: Not on file    Minutes per session: Not on file  . Stress: Not on file  Relationships  . Social Herbalist on phone: Not on file    Gets together: Not on file    Attends religious service: Not on file    Active member of club or organization: Not on file    Attends meetings of clubs or organizations: Not on file    Relationship status: Not on file  Other Topics Concern  . Not on file  Social History Narrative   HH of 4-5    Pets dog   Sleep  ok 7-8 hours.    [employer] 11th grade  Good grades    No school concerns   caffiene/ tea all the time and sodas . No milk.      Born [place]    Allergies: No Known Allergies  Metabolic Disorder Labs: Lab Results  Component Value Date   HGBA1C 5.8 (H) 06/15/2019   MPG 119.76 06/15/2019   Lab Results  Component Value Date   PROLACTIN 12.8 04/08/2014   No results found for: CHOL, TRIG, HDL, CHOLHDL, VLDL, LDLCALC Lab Results  Component Value Date   TSH 1.788 06/15/2019   TSH 1.542 04/08/2014    Therapeutic Level Labs: No results found for: LITHIUM No results found for: VALPROATE No components found for:  CBMZ  Current Medications: Current Outpatient Medications  Medication Sig Dispense Refill  . sertraline (ZOLOFT) 100 MG tablet Take 1.5 tablets (150 mg total) by mouth daily. 135 tablet 0  . zolpidem (AMBIEN) 5 MG tablet Take 1 tablet (5 mg total) by mouth at bedtime as needed for sleep. 30 tablet 1   No current facility-administered medications for this visit.      Psychiatric Specialty Exam: Review of Systems  Psychiatric/Behavioral: Positive for depression. The patient is nervous/anxious and has insomnia.   All other systems reviewed and are negative.   There were no vitals taken for this visit.There is no height or weight on file to calculate BMI.  General Appearance: NA  Eye Contact:  NA  Speech:  Clear and Coherent and Normal Rate  Volume:  Normal  Mood:  Anxious and Depressed  Affect:  NA  Thought Process:  Goal Directed and Linear  Orientation:  Full (Time, Place, and Person)  Thought Content: Logical   Suicidal Thoughts:  No  Homicidal Thoughts:  No  Memory:  Immediate;   Good Recent;   Good Remote;   Good  Judgement:  Good  Insight:  Good  Psychomotor Activity:  NA  Concentration:  Concentration: Good  Recall:  Good  Fund of Knowledge: Good  Language: Good  Akathisia:  Negative  Handed:  Right  AIMS (if indicated): not done  Assets:   Communication Skills Desire for Improvement Financial Resources/Insurance Housing Physical Health Social Support Vocational/Educational  ADL's:  Intact  Cognition: WNL  Sleep:  Poor   Screenings: AIMS     Admission (Discharged) from OP Visit from 06/15/2019 in [place] [facility]  AIMS Total Score  0    AUDIT     Admission (Discharged) from OP Visit from 06/15/2019 in [place] [facility]  Alcohol Use Disorder Identification Test Final Score (AUDIT)  0    PHQ2-9     Office Visit  [facility] 03/09/2014 [facility] Total Score  0       Assessment and Plan: 23 year oldsingle AAFwith history ofdepressionandgeneralized anxiety disorder who [facility].She reported being increasingly depressed, anxious and having suicidal thoughts of overdosing on medications (none were being prescribed).Shecould not identify anyspecific issues that led to the increased stress.Miquelle was started on sertraline in the hospital and discharged in improved condition. She noticed some initial improvement in mood but as she still reported some depression/anxiety we increased the dose to 100 mg a month ago. She has not been sleeping well with trazodone so we instead started zolpidem 5 mg. She still reports feeling anxious and feels that one medication makes her "hyper" while the other tries to help her sleep. She has been taking both at bedtime and it seems that a higher dose of sertraline interferes with sleep. She thought of taking sertraline in AM but wanted to discuss this with me first. Her appetite is better and she denies having SI.Shereports feeling depressed and anxiousfor many yearsand has a single episode of suicidal attempt by OD as a 23 year old.  She has a hx of bipolar disorder in family (maternal aunt and one sister)but denied any episodes of excessive spending, increased  goal-directed activity, or euphoria. 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   Dx: MDD recurrent mild to moderate; GAD  Plan: Increase sertraline to 150 mg and move it to AM, continue zolpidem 5 mg prn insomnia (she may try 10 mg if 5 mg is not effective). The plan was discussed with patient who had an opportunity to ask questions and these were all answered. I spend 25 minutes inphone consultationwith the patient     Magdalene Patricia, MD 09/03/2019, 10:19 AM

## 2019-09-24 ENCOUNTER — Other Ambulatory Visit: Payer: Self-pay | Admitting: Otolaryngology

## 2019-09-24 DIAGNOSIS — E041 Nontoxic single thyroid nodule: Secondary | ICD-10-CM

## 2019-10-01 ENCOUNTER — Ambulatory Visit
Admission: RE | Admit: 2019-10-01 | Discharge: 2019-10-01 | Disposition: A | Discharge status: Home / Self Care | Payer: Federal, State, Local not specified - PPO | Source: Ambulatory Visit | Attending: Otolaryngology | Admitting: Otolaryngology

## 2019-10-01 ENCOUNTER — Other Ambulatory Visit (HOSPITAL_COMMUNITY): Payer: Self-pay | Admitting: Psychiatry

## 2019-10-01 DIAGNOSIS — E041 Nontoxic single thyroid nodule: Secondary | ICD-10-CM

## 2019-10-16 ENCOUNTER — Other Ambulatory Visit: Payer: Self-pay

## 2019-10-16 ENCOUNTER — Ambulatory Visit (INDEPENDENT_AMBULATORY_CARE_PROVIDER_SITE_OTHER): Payer: Federal, State, Local not specified - PPO | Admitting: Psychiatry

## 2019-10-16 DIAGNOSIS — F411 Generalized anxiety disorder: Secondary | ICD-10-CM

## 2019-10-16 DIAGNOSIS — F3341 Major depressive disorder, recurrent, in partial remission: Secondary | ICD-10-CM

## 2019-10-16 MED ORDER — ZOLPIDEM TARTRATE 5 MG PO TABS: 5.0000 mg | ORAL_TABLET | Freq: Every evening | ORAL | 2 refills | Status: DC | PRN

## 2019-10-16 MED ORDER — SERTRALINE HCL 100 MG PO TABS: 100.0000 mg | ORAL_TABLET | Freq: Every day | ORAL | 0 refills | Status: DC

## 2019-10-16 NOTE — Progress Notes (Signed)
BH MD/PA/NP OP Progress Note  10/16/2019 11:46 AM April Montes  MRN:  [id number] Interview was conducted by phone and I verified that I was speaking with the correct person using two identifiers. I discussed the limitations of evaluation and management by telemedicine and  the availability of in person appointments. Patient expressed understanding and agreed to proceed.  Chief Complaint: Excessive sedation, mood better.  HPI: 23 year oldsingle AAFwith history ofdepressionandgeneralized anxiety disorder who [facility].She reported being increasingly depressed, anxious and having suicidal thoughts of overdosing on medications (none were being prescribed).Shecould not identify anyspecific issues that led to the increased stress.She has a single episode of suicidal attempt by OD as a 23 year old.  She has a hx of bipolar disorder in family (maternal aunt and one sister)but denied any episodes of excessive spending, increased goal-directed activity, or euphoria.April Montes was started on sertraline in the hospital and discharged in improved condition. Shenoticed some initial improvement in mood but as she still reported some depression/anxiety we increased the dose to 100 mg a month ago. She hasnot been sleeping well with trazodone so we instead started zolpidem 5 mg. She still reports feeling anxious and feels that one medication makes her "hyper" while the other tries to help her sleep. She has been taking both at bedtime and it seems that a higher dose of sertraline interferes with sleep. We moved it to morning and increased dose to 150 mg but it makes her feel lethargic. 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  Visit Diagnosis:    ICD-10-CM   1. GAD (generalized  anxiety disorder)  F41.1   2. Major depressive disorder, recurrent episode, in partial remission (HCC)  F33.41     Past Psychiatric History: Please see intake H&P.  Past Medical History:  Past Medical History:  Diagnosis Date  . Anemia   . Nocturnal enuresis    hx seen by Dr. Isabel CapriceGrapey in the past  . OM (otitis media)     Past Surgical History:  Procedure Laterality Date  . ADENOIDECTOMY    . TONSILLECTOMY      Family Psychiatric History: Reviewed.  Family History:  Family History  Problem Relation Age of Onset  . Hypertension Mother   . Other Mother        abn ekg but nl echo and ht rx  . Bipolar disorder Sister   . Allergy (severe) Other   . Other Other        enlarged heart maternal uncle died in sleep age 22  . Diabetes type II Other   . Bipolar disorder Maternal Aunt     Social History:  Social History   Socioeconomic History  . Marital status: Single    Spouse name: Not on file  . Number of children: Not on file  . Years of education: Not on file  . Highest education level: Not on file  Occupational History  . Not on file  Social Needs  . Financial resource strain: Not on file  . Food insecurity    Worry: Not on file    Inability: Not on file  . Transportation needs    Medical: Not on file    Non-medical: Not on file  Tobacco Use  . Smoking status: Current Some Day Smoker  . Smokeless tobacco: Current User  Substance and Sexual Activity  . Alcohol use: No  . Drug use: No  . Sexual activity: Yes    Partners: Male    Birth control/protection: Condom    Comment: condoms everytime  Lifestyle  . Physical activity    Days per week: Not on file    Minutes per session: Not on file  . Stress: Not on file  Relationships  . Social Musician on phone: Not on file    Gets together: Not on file    Attends religious service: Not on file    Active member of club or organization: Not on file    Attends meetings of clubs or organizations: Not on  file    Relationship status: Not on file  Other Topics Concern  . 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    [name] 11th grade  Good grades    No school concerns   caffiene/ tea all the time and sodas . No milk.      Born [place]    Allergies: No Known Allergies  Metabolic Disorder Labs: Lab Results  Component Value Date   HGBA1C 5.8 (H) 06/15/2019   MPG 119.76 06/15/2019   Lab Results  Component Value Date   PROLACTIN 12.8 04/08/2014   No results found for: CHOL, TRIG, HDL, CHOLHDL, VLDL, LDLCALC Lab Results  Component Value Date   TSH 1.788 06/15/2019   TSH 1.542 04/08/2014    Therapeutic Level Labs: No results found for: LITHIUM No results found for: VALPROATE No components found for:  CBMZ  Current Medications: Current Outpatient Medications  Medication Sig Dispense Refill  . sertraline (ZOLOFT) 100 MG tablet Take 1.5 tablets (150 mg total) by mouth daily. 135 tablet 0  . zolpidem (AMBIEN) 5 MG tablet Take 1 tablet (5 mg total) by mouth at bedtime as needed for sleep. 30 tablet 1   No current facility-administered medications for this visit.       Psychiatric Specialty Exam: Review of Systems  Constitutional: Positive for malaise/fatigue.  All other systems reviewed and are negative.   There were no vitals taken for this visit.There is no height or weight on file to calculate BMI.  General Appearance: NA  Eye Contact:  NA  Speech:  Clear and Coherent and Normal Rate  Volume:  Normal  Mood:  Mild anxiety.  Affect:  NA  Thought Process:  Goal Directed and Linear  Orientation:  Full (Time, Place, and Person)  Thought Content: Logical   Suicidal Thoughts:  No  Homicidal Thoughts:  No  Memory:  Immediate;   Good Recent;   Good Remote;   Good  Judgement:  Good  Insight:  Good  Psychomotor Activity:  NA  Concentration:  Concentration: Good  Recall:  Good  Fund of Knowledge: Good  Language: Good   Akathisia:  Negative  Handed:  Right  AIMS (if indicated): not done  Assets:  Communication Skills Desire for Improvement Financial Resources/Insurance Housing Physical Health Social Support Talents/Skills  ADL's:  Intact  Cognition: WNL  Sleep:  Fair   Screenings: AIMS     [facility]  AIMS Total Score  0    AUDIT     Admission (Discharged) from [facility]  Alcohol Use Disorder Identification Test Final  Score (AUDIT)  0    PHQ2-9     Office Visit from 03/09/2014 in [place] [facility] Total Score  0       Assessment and Plan: 60 year oldsingle AAFwith history ofdepressionandgeneralized anxiety disorder [facility].She reported being increasingly depressed, anxious and having suicidal thoughts of overdosing on medications (none were being prescribed).Shecould not identify anyspecific issues that led to the increased stress.She has a single episode of suicidal attempt by OD as a 23 year old.  She has a hx of bipolar disorder in family (maternal aunt and one sister)but denied any episodes of excessive spending, increased goal-directed activity, or euphoria.Aleeah was started on sertraline in the hospital and discharged in improved condition. Shenoticed some initial improvement in mood but as she still reported some depression/anxiety we increased the dose to 100 mg a month ago. She hasnot been sleeping well with trazodone so we instead started zolpidem 5 mg. She still reports feeling anxious and feels that one medication makes her "hyper" while the other tries to help her sleep. She has been taking both at bedtime and it seems that a higher dose of sertraline interferes with sleep. We moved it to morning and increased dose to 150 mg but it makes her feel  lethargic. 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  Dx: MDD recurrentin partial remission; GAD  Plan:Decrease sertralineback to 100 mgin AM, continue zolpidem 5 mg prn insomnia. Next appointment in 3 months.The plan was discussed with patient who had an opportunity to ask questions and these were all answered. I spend25 minutes inphone consultationwith the patient   Stephanie Acre, MD 10/16/2019, 11:46 AM

## 2019-11-11 ENCOUNTER — Other Ambulatory Visit: Payer: Self-pay | Admitting: *Deleted

## 2019-11-11 DIAGNOSIS — Z20822 Contact with and (suspected) exposure to covid-19: Secondary | ICD-10-CM

## 2019-11-13 LAB — NOVEL CORONAVIRUS, NAA: SARS-CoV-2, NAA: NOT DETECTED

## 2020-01-14 ENCOUNTER — Ambulatory Visit (INDEPENDENT_AMBULATORY_CARE_PROVIDER_SITE_OTHER): Payer: Federal, State, Local not specified - PPO | Admitting: Psychiatry

## 2020-01-14 ENCOUNTER — Other Ambulatory Visit: Payer: Self-pay

## 2020-01-14 DIAGNOSIS — F3341 Major depressive disorder, recurrent, in partial remission: Secondary | ICD-10-CM | POA: Diagnosis not present

## 2020-01-14 DIAGNOSIS — F411 Generalized anxiety disorder: Secondary | ICD-10-CM | POA: Diagnosis not present

## 2020-01-14 MED ORDER — VILAZODONE HCL 10 MG PO TABS: ORAL_TABLET | ORAL | 0 refills | Status: DC

## 2020-01-14 MED ORDER — ZOLPIDEM TARTRATE 5 MG PO TABS: 5.0000 mg | ORAL_TABLET | Freq: Every evening | ORAL | 2 refills | Status: DC | PRN

## 2020-01-14 NOTE — Progress Notes (Signed)
BH MD/PA/NP OP Progress Note  01/14/2020 3:44 PM April Montes  MRN:  [id number] Interview was conducted by phone and I verified that I was speaking with the correct person using two identifiers. I discussed the limitations of evaluation and management by telemedicine and  the availability of in person appointments. Patient expressed understanding and agreed to proceed.  Chief Complaint: Increased anxiety and some depression.  HPI: 23 year oldsingle AAFwith history ofdepressionandgeneralized anxiety disorder [facility].She reported being increasingly depressed, anxious and having suicidal thoughts of overdosing on medications (none were being prescribed).Shecould not identify anyspecific issues that led to the increased stress.She has a single episode of suicidal attempt by OD as a 24 year old. She has a hx of bipolar disorder in family (maternal aunt and one sister)but denied any episodes of excessive spending, increased goal-directed activity, or euphoria.April Montes was started on sertraline in the hospital and discharged in improved condition. Shenoticedsome initialimprovement in mood butas she stillreported some depression/anxietywe increased the dose to 100 mg a month ago.Shehasnotbeensleeping wellwith trazodone so we instead started zolpidem 5 mg. She still reports feeling anxious and feels that one medication makes her "hyper" while the other tries to help her sleep. She has been taking both at bedtime and it seems that a higher dose of sertraline interferes with sleep. We moved it to morning and increased dose to 150 mg but it makes her feel lethargic. Her appetite isbetter andshedenies havingSI.Shereports feeling a little more depressed and anxious and would like to try changing antidepressant as attempts to increase dose were not successful due to intolerability. 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  Visit Diagnosis:    ICD-10-CM   1. GAD (generalized anxiety disorder)  F41.1   2. Major depressive disorder, recurrent episode, in partial remission (HCC)  F33.41     Past Psychiatric History: Please see intake H&P.  Past Medical History:  Past Medical History:  Diagnosis Date  . Anemia   . Nocturnal enuresis    hx seen by Dr. Isabel Caprice in the past  . OM (otitis media)     Past Surgical History:  Procedure Laterality Date  . ADENOIDECTOMY    . TONSILLECTOMY      Family Psychiatric History: Reviewed.  Family History:  Family History  Problem Relation Age of Onset  . Hypertension Mother   . Other Mother        abn ekg but nl echo and ht rx  . Bipolar disorder Sister   . Allergy (severe) Other   . Other Other        enlarged heart maternal uncle died in sleep age 48  . Diabetes type II Other   . Bipolar disorder Maternal Aunt     Social History:  Social History   Socioeconomic History  . Marital status: Single    Spouse name: Not on file  . Number of children: Not on file  . Years of education: Not on file  . Highest education level: Not on file  Occupational History  . Not on file  Tobacco Use  . Smoking status: Current Some Day Smoker  . Smokeless tobacco: Current User  Substance and Sexual Activity  . Alcohol use: No  . Drug use: No  . Sexual activity: Yes    Partners: Male    Birth control/protection: Condom    Comment: condoms everytime  Other Topics  Concern  . 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    [employer] 11th grade  Good grades    No school concerns   caffiene/ tea all the time and sodas . No milk.      Born [place]   Social Determinants of Health   Financial Resource Strain:   . Difficulty of Paying Living Expenses: Not on file  Food Insecurity:   . Worried About Charity fundraiser in the Last Year: Not on file   . Ran Out of Food in the Last Year: Not on file  Transportation Needs:   . Lack of Transportation (Medical): Not on file  . Lack of Transportation (Non-Medical): Not on file  Physical Activity:   . Days of Exercise per Week: Not on file  . Minutes of Exercise per Session: Not on file  Stress:   . Feeling of Stress : Not on file  Social Connections:   . Frequency of Communication with Friends and Family: Not on file  . Frequency of Social Gatherings with Friends and Family: Not on file  . Attends Religious Services: Not on file  . Active Member of Clubs or Organizations: Not on file  . Attends Archivist Meetings: Not on file  . Marital Status: Not on file    Allergies: No Known Allergies  Metabolic Disorder Labs: Lab Results  Component Value Date   HGBA1C 5.8 (H) 06/15/2019   MPG 119.76 06/15/2019   Lab Results  Component Value Date   PROLACTIN 12.8 04/08/2014   No results found for: CHOL, TRIG, HDL, CHOLHDL, VLDL, LDLCALC Lab Results  Component Value Date   TSH 1.788 06/15/2019   TSH 1.542 04/08/2014    Therapeutic Level Labs: No results found for: LITHIUM No results found for: VALPROATE No components found for:  CBMZ  Current Medications: Current Outpatient Medications  Medication Sig Dispense Refill  . Vilazodone HCl (VIIBRYD) 10 MG TABS Take 1 tablet (10 mg total) by mouth daily for 7 days, THEN 2 tablets (20 mg total) daily for 23 days. 53 tablet 0  . zolpidem (AMBIEN) 5 MG tablet Take 1 tablet (5 mg total) by mouth at bedtime as needed for sleep. 30 tablet 2   No current facility-administered medications for this visit.      Psychiatric Specialty Exam: Review of Systems  Psychiatric/Behavioral: The patient is nervous/anxious.   All other systems reviewed and are negative.   There were no vitals taken for this visit.There is no height or weight on file to calculate BMI.  General Appearance: NA  Eye Contact:  NA  Speech:  Clear and Coherent  and Normal Rate  Volume:  Normal  Mood:  Anxious and Depressed  Affect:  NA  Thought Process:  Goal Directed and Linear  Orientation:  Full (Time, Place, and Person)  Thought Content: Logical   Suicidal Thoughts:  No  Homicidal Thoughts:  No  Memory:  Immediate;   Good Recent;   Good Remote;   Good  Judgement:  Good  Insight:  Good  Psychomotor Activity:  NA  Concentration:  Concentration: Good  Recall:  Good  Fund of Knowledge: Good  Language: Good  Akathisia:  Negative  Handed:  Right  AIMS (if indicated): not done  Assets:  Communication Skills Desire for Improvement Financial Resources/Insurance Housing Physical Health Social Support Talents/Skills  ADL's:  Intact  Cognition: WNL  Sleep:  Good   Screenings: AIMS     Ad[facility] (Discharged) [facility]  AIMS Total Score  0    AUDIT     Admission (Discharged) from [facility]  Alcohol Use Disorder Identification Test Final Score (AUDIT)  0    PHQ2-9     [facility] Visit [facility] 03/09/2014 [facility] Total Score  0       Assessment and Plan: 23 year oldsingle AAFwith history ofdepressionandgeneralized anxiety disorder who [facility].She reported being increasingly depressed, anxious and having suicidal thoughts of overdosing on medications (none were being prescribed).Shecould not identify anyspecific issues that led to the increased stress.She has a single episode of suicidal attempt by OD as a 24 year old. She has a hx of bipolar disorder in family (maternal aunt and one sister)but denied any episodes of excessive spending, increased goal-directed activity, or euphoria.Joslyne was started on sertraline in the hospital and discharged in improved condition. Shenoticedsome initialimprovement in mood butas she  stillreported some depression/anxietywe increased the dose to 100 mg a month ago.Shehasnotbeensleeping wellwith trazodone so we instead started zolpidem 5 mg. She still reports feeling anxious and feels that one medication makes her "hyper" while the other tries to help her sleep. She has been taking both at bedtime and it seems that a higher dose of sertraline interferes with sleep. We moved it to morning and increased dose to 150 mg but it makes her feel lethargic. Her appetite isbetter andshedenies havingSI.Shereports feeling a little more depressed and anxious and would like to try changing antidepressant as attempts to increase dose were not successful due to intolerability. 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  Dx: MDD recurrentmild; GAD  Plan:Decrease sertralineto 50 mg for one week then stop. Start Viibryd 10 mg x 7 days then increase to 20 mg daily. Continuezolpidem 5 mg prn insomnia (uses it rarely). Next appointment in 1 month.The plan was discussed with patient who had an opportunity to ask questions and these were all answered. I spend25 minutes inphone consultationwith the patient   Magdalene Patricia, MD 01/14/2020, 3:44 PM

## 2020-01-23 DIAGNOSIS — E282 Polycystic ovarian syndrome: Secondary | ICD-10-CM | POA: Diagnosis not present

## 2020-01-23 DIAGNOSIS — N912 Amenorrhea, unspecified: Secondary | ICD-10-CM | POA: Diagnosis not present

## 2020-03-10 ENCOUNTER — Other Ambulatory Visit: Payer: Self-pay

## 2020-03-10 ENCOUNTER — Ambulatory Visit (INDEPENDENT_AMBULATORY_CARE_PROVIDER_SITE_OTHER): Payer: Federal, State, Local not specified - PPO | Admitting: Psychiatry

## 2020-03-10 DIAGNOSIS — F3341 Major depressive disorder, recurrent, in partial remission: Secondary | ICD-10-CM

## 2020-03-10 DIAGNOSIS — F411 Generalized anxiety disorder: Secondary | ICD-10-CM

## 2020-03-10 MED ORDER — FLUOXETINE HCL 20 MG PO CAPS: ORAL_CAPSULE | ORAL | 0 refills | Status: DC

## 2020-03-10 NOTE — Progress Notes (Signed)
[place] MD/PA/NP OP Progress Note  03/10/2020 4:08 PM SHARONANN MALBROUGH  MRN:  [id number] Interview was conducted by phone and I verified that I was speaking with the correct person using two identifiers. I discussed the limitations of evaluation and management by telemedicine and  the availability of in person appointments. Patient expressed understanding and agreed to proceed.  Chief Complaint: Fatigue.  HPI: 24 year oldsingle AAFwith history ofdepressionandgeneralized anxiety disorder [facility].She reported being increasingly depressed, anxious and having suicidal thoughts of overdosing on medications (none were being prescribed).Shecould not identify anyspecific issues that led to the increased stress.She hasa single episode of suicidal attempt by OD as a 24 year old. She has a hx of bipolar disorder in family (maternal aunt and one sister)but denied any episodes of excessive spending, increased goal-directed activity, or euphoria.Mykel was started on sertraline in the hospital and discharged in improved condition. Shenoticedsome initialimprovement in mood butas she stillreported some depression/anxietywe increased the dose to 100 mg in January.Shehasnotbeensleeping wellwith trazodone so we instead started zolpidem 5 mg. She still reports feeling anxious and feels that one medication makes her "hyper" while the other tries to help her sleep. She has been taking both at bedtime and it seems that a higher dose of sertraline interferes with sleep.We moved it to morning and increased dose to 150 mg but it makes her feel lethargic.She was supposed to switch to Viibryd but somemthing happened with RX and she could not fill it. She remains on 100 mg of  Sertraline in AM. Her appetite isbetter andshedenies havingSI.Shereports feelinga little more depressed and anxious and would like to try changing antidepressant as attempts to  increase dose were not successful due to intolerability. 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  Visit Diagnosis:    ICD-10-CM   1. GAD (generalized anxiety disorder)  F41.1   2. Major depressive disorder, recurrent episode, in partial remission (HCC)  F33.41     Past Psychiatric History: Please see intake H&P.  Past Medical History:  Past Medical History:  Diagnosis Date  . Anemia   . Nocturnal enuresis    hx seen by Dr. Risa Grill in the past  . OM (otitis media)     Past Surgical History:  Procedure Laterality Date  . ADENOIDECTOMY    . TONSILLECTOMY      Family Psychiatric History: Reviewed.  Family History:  Family History  Problem Relation Age of Onset  . Hypertension Mother   . Other Mother        abn ekg but nl echo and ht rx  . Bipolar disorder Sister   . Allergy (severe) Other   . Other Other        enlarged heart maternal uncle died in sleep age 63  . Diabetes type II Other   . Bipolar disorder Maternal Aunt     Social History:  Social History   Socioeconomic History  . Marital status: Single    Spouse name: Not on file  . Number of children: Not on file  . Years of education: Not on file  . Highest education level: Not on file  Occupational History  . Not on file  Tobacco Use  . Smoking status: Current Some Day Smoker  . Smokeless tobacco: Current User  Substance and Sexual Activity  . Alcohol use: No  . Drug use: No  . Sexual activity: Yes  Partners: Male    Birth control/protection: Condom    Comment: condoms everytime  Other Topics Concern  . 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    [name] 11th grade  Good grades    No school concerns   caffiene/ tea all the time and sodas . No milk.      Born [place]   Social Determinants of Health   Financial Resource Strain:   . Difficulty of Paying Living  Expenses:   Food Insecurity:   . Worried About Programme researcher, broadcasting/film/video in the Last Year:   . Barista in the Last Year:   Transportation Needs:   . Freight forwarder (Medical):   Marland Kitchen Lack of Transportation (Non-Medical):   Physical Activity:   . Days of Exercise per Week:   . Minutes of Exercise per Session:   Stress:   . Feeling of Stress :   Social Connections:   . Frequency of Communication with Friends and Family:   . Frequency of Social Gatherings with Friends and Family:   . Attends Religious Services:   . Active Member of Clubs or Organizations:   . Attends Banker Meetings:   Marland Kitchen Marital Status:     Allergies: No Known Allergies  Metabolic Disorder Labs: Lab Results  Component Value Date   HGBA1C 5.8 (H) 06/15/2019   MPG 119.76 06/15/2019   Lab Results  Component Value Date   PROLACTIN 12.8 04/08/2014   No results found for: CHOL, TRIG, HDL, CHOLHDL, VLDL, LDLCALC Lab Results  Component Value Date   TSH 1.788 06/15/2019   TSH 1.542 04/08/2014    Therapeutic Level Labs: No results found for: LITHIUM No results found for: VALPROATE No components found for:  CBMZ  Current Medications: Current Outpatient Medications  Medication Sig Dispense Refill  . FLUoxetine (PROZAC) 20 MG capsule Take 1 capsule (20 mg total) by mouth daily for 7 days, THEN 2 capsules (40 mg total) daily. 73 capsule 0  . zolpidem (AMBIEN) 5 MG tablet Take 1 tablet (5 mg total) by mouth at bedtime as needed for sleep. 30 tablet 2   No current facility-administered medications for this visit.     Psychiatric Specialty Exam: Review of Systems  Constitutional: Positive for fatigue.  Psychiatric/Behavioral: The patient is nervous/anxious.   All other systems reviewed and are negative.   There were no vitals taken for this visit.There is no height or weight on file to calculate BMI.  General Appearance: NA  Eye Contact:  NA  Speech:  Clear and Coherent and Normal Rate   Volume:  Normal  Mood:  Anxious  Affect:  NA  Thought Process:  Goal Directed and Linear  Orientation:  Full (Time, Place, and Person)  Thought Content: Logical   Suicidal Thoughts:  No  Homicidal Thoughts:  No  Memory:  Immediate;   Good Recent;   Good Remote;   Good  Judgement:  Good  Insight:  Good  Psychomotor Activity:  NA  Concentration:  Concentration: Fair  Recall:  Good  Fund of Knowledge: Good  Language: Good  Akathisia:  Negative  Handed:  Right  AIMS (if indicated): not done  Assets:  Communication Skills Desire for Improvement Financial Resources/Insurance Housing Physical Health Talents/Skills  ADL's:  Intact  Cognition: WNL  Sleep:  Fair   Screenings: AIMS     Admission (Discharged) from OP  [facility]  AIMS Total Score  0    AUDIT     Admission (Discharged) from [facility]  Alcohol Use Disorder Identification Test Final Score (AUDIT)  0    PHQ2-9     Office Visit from 03/09/2014 in [facility] Total Score  0       Assessment and Plan: 24 year oldsingle AAFwith history ofdepressionandgeneralized anxiety disorder [facility]She reported being increasingly depressed, anxious and having suicidal thoughts of overdosing on medications (none were being prescribed).Shecould not identify anyspecific issues that led to the increased stress.She hasa single episode of suicidal attempt by OD as a 24 year old. She has a hx of bipolar disorder in family (maternal aunt and one sister)but denied any episodes of excessive spending, increased goal-directed activity, or euphoria.Verdelle was started on sertraline in the hospital and discharged in improved condition. Shenoticedsome initialimprovement in mood butas she stillreported some depression/anxietywe  increased the dose to 100 mg in January.Shehasnotbeensleeping wellwith trazodone so we instead started zolpidem 5 mg. She still reports feeling anxious and feels that one medication makes her "hyper" while the other tries to help her sleep. She has been taking both at bedtime and it seems that a higher dose of sertraline interferes with sleep.We moved it to morning and increased dose to 150 mg but it makes her feel lethargic.She was supposed to switch to Viibryd but somemthing happened with RX and she could not fill it. She remains on 100 mg of  Sertraline in AM. Her appetite isbetter andshedenies havingSI.Shereports feelinga little more depressed and anxious and would like to try changing antidepressant as attempts to increase dose were not successful due to intolerability. 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  Dx: MDD recurrentmild; GAD  Plan:Decreasesertralineto 50 mg for one week then stop. Concurrently start 20 mg of fluoxetine for 1 week then increase dose to 40 mg in am. Continuezolpidem 5 mg prn insomnia (uses it rarely).Next appointment in 1 month.The plan was discussed with patient who had an opportunity to ask questions and these were all answered. I spend20 minutes inphone consultationwith the patient   Magdalene Patricia, MD 03/10/2020, 4:08 PM

## 2020-03-26 DIAGNOSIS — Z20822 Contact with and (suspected) exposure to covid-19: Secondary | ICD-10-CM | POA: Diagnosis not present

## 2020-04-01 ENCOUNTER — Other Ambulatory Visit (HOSPITAL_COMMUNITY): Payer: Self-pay | Admitting: Psychiatry

## 2020-04-19 ENCOUNTER — Other Ambulatory Visit: Payer: Self-pay

## 2020-04-19 ENCOUNTER — Telehealth (INDEPENDENT_AMBULATORY_CARE_PROVIDER_SITE_OTHER): Payer: Federal, State, Local not specified - PPO | Admitting: Psychiatry

## 2020-04-19 DIAGNOSIS — F3341 Major depressive disorder, recurrent, in partial remission: Secondary | ICD-10-CM | POA: Diagnosis not present

## 2020-04-19 DIAGNOSIS — F411 Generalized anxiety disorder: Secondary | ICD-10-CM

## 2020-04-19 MED ORDER — FLUOXETINE HCL 20 MG PO CAPS: 20.0000 mg | ORAL_CAPSULE | Freq: Every day | ORAL | 1 refills | Status: DC

## 2020-04-19 NOTE — Progress Notes (Signed)
[facility] MD/PA/NP OP Progress Note  04/19/2020 4:37 PM April Montes  MRN:  [id number] Interview was conducted by phone and I verified that I was speaking with the correct person using two identifiers. I discussed the limitations of evaluation and management by telemedicine and  the availability of in person appointments. 23 Patient expressed understanding and agreed to proceed.  Chief Complaint: Sleepiness/fatigue.  HPI: 24 year oldsingle AAFwith history ofdepressionandgeneralized anxiety disorder [facility]She reported being increasingly depressed, anxious and having suicidal thoughts of overdosing on medications (none were being prescribed).Shecould not identify anyspecific issues that led to the increased stress.She hasa single episode of suicidal attempt by OD as a 24 year old. She has a hx of bipolar disorder in family (maternal aunt and one sister)but denied any episodes of excessive spending, increased goal-directed activity, or euphoria.Dao was started on sertraline in the hospital and discharged in improved condition. Shenoticedsome initialimprovement in mood butas she stillreported some depression/anxietywe increased the dose to 100 mg in January.Shehasnotbeensleeping wellwith trazodone so we instead started zolpidem 5 mg. She still reports feeling anxious and feels that 23 medication makes her "hyper" while the other tries to help her sleep. She has been taking both at bedtime and it seems that a higher dose of sertraline interferes with sleep.We moved it to morning and increased dose to 150 mg but it makes her feel lethargic.She was supposed to switch to Viibryd but somemthing happened with RX and she could not fill it. She remains on 100 mg of  Sertraline in AM. Her appetite isbetter andshedenies havingSI.Shereported feelinga little moredepressed and anxiousand would like to try changing antidepressant as  attempts to increase dose were not successful due to intolerability.We cross-tapered off sertraline and added fluoxetine but when dose went up to 40 mg daily she started to feel lethargic. 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  Visit Diagnosis:    ICD-10-CM   1. Major depressive disorder, recurrent episode, in partial remission (HCC)  F33.41   2. GAD (generalized anxiety disorder)  F41.1     Past Psychiatric History: Please see intake H&P>  Past Medical History:  Past Medical History:  Diagnosis Date  . Anemia   . Nocturnal enuresis    hx seen by Dr. Isabel Caprice in the past  . OM (otitis media)     Past Surgical History:  Procedure Laterality Date  . ADENOIDECTOMY    . TONSILLECTOMY      Family Psychiatric History: Reviewed.  Family History:  Family History  Problem Relation Age of Onset  . Hypertension Mother   . Other Mother        abn ekg but nl echo and ht rx  . Bipolar disorder Sister   . Allergy (severe) Other   . Other Other        enlarged heart maternal uncle died in sleep age 36  . Diabetes type II Other   . Bipolar disorder Maternal Aunt     Social History:  Social History   Socioeconomic History  . Marital status: Single    Spouse name: Not on file  . Number of children: Not on file  . Years of education: Not on file  . Highest education level: Not on file  Occupational History  . Not on file  Tobacco Use  . Smoking status: Current Some Day Smoker  . Smokeless tobacco: Current User  Substance and Sexual Activity  . Alcohol use: No  . Drug use: No  . Sexual activity: Yes    Partners: Male    Birth control/protection: Condom    Comment: condoms everytime  Other Topics Concern  . 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    [name] 11th grade  Good grades    No school concerns   caffiene/ tea  all the time and sodas . No milk.      Born [place]   Social Determinants of Health   Financial Resource Strain:   . Difficulty of Paying Living Expenses:   Food Insecurity:   . Worried About Programme researcher, broadcasting/film/video in the Last Year:   . Barista in the Last Year:   Transportation Needs:   . Freight forwarder (Medical):   Marland Kitchen Lack of Transportation (Non-Medical):   Physical Activity:   . Days of Exercise per Week:   . Minutes of Exercise per Session:   Stress:   . Feeling of Stress :   Social Connections:   . Frequency of Communication with Friends and Family:   . Frequency of Social Gatherings with Friends and Family:   . Attends Religious Services:   . Active Member of Clubs or Organizations:   . Attends Banker Meetings:   Marland Kitchen Marital Status:     Allergies: No Known Allergies  Metabolic Disorder Labs: Lab Results  Component Value Date   HGBA1C 5.8 (H) 06/15/2019   MPG 119.76 06/15/2019   Lab Results  Component Value Date   PROLACTIN 12.8 04/08/2014   No results found for: CHOL, TRIG, HDL, CHOLHDL, VLDL, LDLCALC Lab Results  Component Value Date   TSH 1.788 06/15/2019   TSH 1.542 04/08/2014    Therapeutic Level Labs: No results found for: LITHIUM No results found for: VALPROATE No components found for:  CBMZ  Current Medications: Current Outpatient Medications  Medication Sig Dispense Refill  . [START ON 05/08/2020] FLUoxetine (PROZAC) 20 MG capsule Take 1 capsule (20 mg total) by mouth daily at 6 PM. 30 capsule 1  . zolpidem (AMBIEN) 5 MG tablet Take 1 tablet (5 mg total) by mouth at bedtime as needed for sleep. 30 tablet 2   No current facility-administered medications for this visit.     Psychiatric Specialty Exam: Review of Systems  Constitutional: Positive for fatigue.  Psychiatric/Behavioral: The patient is nervous/anxious.   All other systems reviewed and are negative.   There were no vitals taken for this visit.There is  no height or weight on file to calculate BMI.  General Appearance: NA  Eye Contact:  NA  Speech:  Clear and Coherent and Normal Rate  Volume:  Normal  Mood:  Mild anxiety.  Affect:  NA  Thought Process:  Goal Directed and Linear  Orientation:  Full (Time, Place, and Person)  Thought Content: Logical   Suicidal Thoughts:  No  Homicidal Thoughts:  No  Memory:  Immediate;   Good Recent;   Good Remote;   Good  Judgement:  Good  Insight:  Good  Psychomotor Activity:  NA  Concentration:  Concentration: Good  Recall:  Good  Fund of Knowledge: Good  Language: Good  Akathisia:  Negative  Handed:  Right  AIMS (if indicated): not done  Assets:  Communication Skills Desire for Improvement Housing Physical Health Talents/Skills  ADL's:  Intact  Cognition:  WNL  Sleep:  Excessive   Screenings: AIMS     Admission (Discharged) from OP Visit from 06/15/2019 in [place] [facility]  AIMS Total Score  0    AUDIT     Admission (Discharged) from OP Visit from 06/15/2019 in [facility] [facility]  Alcohol Use Disorder Identification Test Final Score (AUDIT)  0    PHQ2-9     Office Visit from 03/09/2014 in [place] [facility] Total Score  0       Assessment and Plan: 87 year oldsingle AAFwith history ofdepressionandgeneralized anxiety disorder [facility]She reported being increasingly depressed, anxious and having suicidal thoughts of overdosing on medications (none were being prescribed).Shecould not identify anyspecific issues that led to the increased stress.She hasa single episode of suicidal attempt by OD as a 24 year old. She has a hx of bipolar disorder in family (maternal aunt and one sister)but denied any episodes of excessive spending, increased goal-directed activity, or euphoria.Melvin was started on sertraline in the hospital and discharged in  improved condition. Shenoticedsome initialimprovement in mood butas she stillreported some depression/anxietywe increased the dose to 100 mg in January.Shehasnotbeensleeping wellwith trazodone so we instead started zolpidem 5 mg. She still reports feeling anxious and feels that 23 medication makes her "hyper" while the other tries to help her sleep. She has been taking both at bedtime and it seems that a higher dose of sertraline interferes with sleep.We moved it to morning and increased dose to 150 mg but it makes her feel lethargic.She was supposed to switch to Viibryd but somemthing happened with RX and she could not fill it. She remains on 100 mg of  Sertraline in AM. Her appetite isbetter andshedenies havingSI.Shereported feelinga little moredepressed and anxiousand would like to try changing antidepressant as attempts to increase dose were not successful due to intolerability.We cross-tapered off sertraline and added fluoxetine but when dose went up to 40 mg daily she started to feel lethargic. 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  Dx: MDD recurrentin partial remission; GAD  Plan:Move fluoxetine to PM and if still feeling lethargic decrease dose to 20 mg (still in the evening). Continuezolpidem 5 mg prn insomnia(uses it rarely).Next appointment in61months.The plan was discussed with patient who had an opportunity to ask questions and these were all answered. I spend20 minutes inphone consultationwith the patient    Stephanie Acre, MD 04/19/2020, 4:37 PM

## 2020-04-22 DIAGNOSIS — E282 Polycystic ovarian syndrome: Secondary | ICD-10-CM | POA: Diagnosis not present

## 2020-04-22 DIAGNOSIS — N912 Amenorrhea, unspecified: Secondary | ICD-10-CM | POA: Diagnosis not present

## 2020-05-18 ENCOUNTER — Telehealth (INDEPENDENT_AMBULATORY_CARE_PROVIDER_SITE_OTHER): Payer: Federal, State, Local not specified - PPO | Admitting: Psychiatry

## 2020-05-18 ENCOUNTER — Other Ambulatory Visit: Payer: Self-pay

## 2020-05-18 DIAGNOSIS — F411 Generalized anxiety disorder: Secondary | ICD-10-CM

## 2020-05-18 DIAGNOSIS — F3341 Major depressive disorder, recurrent, in partial remission: Secondary | ICD-10-CM | POA: Diagnosis not present

## 2020-05-18 MED ORDER — FLUOXETINE HCL 20 MG PO CAPS: 20.0000 mg | ORAL_CAPSULE | Freq: Every day | ORAL | 0 refills | Status: DC

## 2020-05-18 NOTE — Progress Notes (Signed)
BH MD/PA/NP OP Progress Note  05/18/2020 3:10 PM April Montes  MRN:  [id number] Interview was conducted by phone and I verified that I was speaking with the correct person using two identifiers. I discussed the limitations of evaluation and management by telemedicine and  the availability of in person appointments. Patient expressed understanding and agreed to proceed. Patient location - at work; physician - home office.  Chief Complaint: Fatigue.  HPI: 24 year oldsingle AAFwith history ofdepressionandgeneralized anxiety disorder who [facility].She reported being increasingly depressed, anxious and having suicidal thoughts of overdosing on medications (none were being prescribed).Shecould not identify anyspecific issues that led to the increased stress.She hasa single episode of suicidal attempt by OD as a 24 year old. She has a hx of bipolar disorder in family (maternal aunt and one sister)but denied any episodes of excessive spending, increased goal-directed activity, or euphoria.April Montes was started on sertraline in the hospital and discharged in improved condition. Shenoticedsome initialimprovement in mood butas she stillreported some depression/anxietywe increased the dose to 100 mgin January.Shehasnotbeensleeping wellwith trazodone so we instead started zolpidem 5 mg. She still reports feeling anxious and feels that one medication makes her "hyper" while the other tries to help her sleep. She has been taking both at bedtime and it seems that a higher dose of sertraline interferes with sleep.We moved it to morning and increased dose to 150 mg but it makes her feel lethargic.She was supposed to switch to Viibryd but somemthing happened with RX and she could not fill it. She remains on 100 mg of Sertraline in AM. Her appetite isbetter andshedenies havingSI.Shereported feelinga little moredepressed and anxiousand would  like to try changing antidepressant as attempts to increase dose were not successful due to intolerability.We cross-tapered off sertraline and added fluoxetine but when dose went up to 40 mg daily she started to feel lethargic. Anxiety subsided though.We then moved it to PM but daytime fatigue continues. 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   Visit Diagnosis:    ICD-10-CM   1. Major depressive disorder, recurrent episode, in partial remission (HCC)  F33.41   2. GAD (generalized anxiety disorder)  F41.1     Past Psychiatric History: Please see intake H&P.  Past Medical History:  Past Medical History:  Diagnosis Date  . Anemia   . Nocturnal enuresis    hx seen by Dr. Isabel Caprice in the past  . OM (otitis media)     Past Surgical History:  Procedure Laterality Date  . ADENOIDECTOMY    . TONSILLECTOMY      Family Psychiatric History: Reviewed.  Family History:  Family History  Problem Relation Age of Onset  . Hypertension Mother   . Other Mother        abn ekg but nl echo and ht rx  . Bipolar disorder Sister   . Allergy (severe) Other   . Other Other        enlarged heart maternal uncle died in sleep age 47  . Diabetes type II Other   . Bipolar disorder Maternal Aunt     Social History:  Social History   Socioeconomic History  . Marital status: Single    Spouse name: Not on file  . Number of children: Not on file  . Years of education: Not on file  . Highest education level: Not on file  Occupational History  .  Not on file  Tobacco Use  . Smoking status: Current Some Day Smoker  . Smokeless tobacco: Current User  Substance and Sexual Activity  . Alcohol use: No  . Drug use: No  . Sexual activity: Yes    Partners: Male    Birth control/protection: Condom    Comment: condoms everytime  Other Topics Concern  . Not on file  Social History Narrative   HH of  4-5    Pets dog   Sleep ok 7-8 hours.    [name] 11th grade  Good grades    No school concerns   caffiene/ tea all the time and sodas . No milk.      Born [place]   Social Determinants of Health   Financial Resource Strain:   . Difficulty of Paying Living Expenses:   Food Insecurity:   . Worried About Programme researcher, broadcasting/film/video in the Last Year:   . Barista in the Last Year:   Transportation Needs:   . Freight forwarder (Medical):   Marland Kitchen Lack of Transportation (Non-Medical):   Physical Activity:   . Days of Exercise per Week:   . Minutes of Exercise per Session:   Stress:   . Feeling of Stress :   Social Connections:   . Frequency of Communication with Friends and Family:   . Frequency of Social Gatherings with Friends and Family:   . Attends Religious Services:   . Active Member of Clubs or Organizations:   . Attends Banker Meetings:   Marland Kitchen Marital Status:     Allergies: No Known Allergies  Metabolic Disorder Labs: Lab Results  Component Value Date   HGBA1C 5.8 (H) 06/15/2019   MPG 119.76 06/15/2019   Lab Results  Component Value Date   PROLACTIN 12.8 04/08/2014   No results found for: CHOL, TRIG, HDL, CHOLHDL, VLDL, LDLCALC Lab Results  Component Value Date   TSH 1.788 06/15/2019   TSH 1.542 04/08/2014    Therapeutic Level Labs: No results found for: LITHIUM No results found for: VALPROATE No components found for:  CBMZ  Current Medications: Current Outpatient Medications  Medication Sig Dispense Refill  . FLUoxetine (PROZAC) 20 MG capsule Take 1 capsule (20 mg total) by mouth daily at 6 PM. 90 capsule 0  . zolpidem (AMBIEN) 5 MG tablet Take 1 tablet (5 mg total) by mouth at bedtime as needed for sleep. 30 tablet 2   No current facility-administered medications for this visit.       Psychiatric Specialty Exam: Review of Systems  Constitutional: Positive for fatigue.    There were no vitals taken for this visit.There is no  height or weight on file to calculate BMI.  General Appearance: NA  Eye Contact:  NA  Speech:  Clear and Coherent and Normal Rate  Volume:  Normal  Mood:  Euthymic  Affect:  NA  Thought Process:  Goal Directed  Orientation:  Full (Time, Place, and Person)  Thought Content: Logical   Suicidal Thoughts:  No  Homicidal Thoughts:  No  Memory:  Immediate;   Good Recent;   Good Remote;   Good  Judgement:  Good  Insight:  Good  Psychomotor Activity:  NA  Concentration:  Concentration: Good  Recall:  Good  Fund of Knowledge: Good  Language: Good  Akathisia:  Negative  Handed:  Right  AIMS (if indicated): not done  Assets:  Communication Skills Desire for Improvement Housing Physical Health Talents/Skills  ADL's:  Intact  Cognition: WNL  Sleep:  Good   Screenings: AIMS     Admission (Discharged) from OP Visit from 06/15/2019 in [place] [facility]  AIMS Total Score  0    AUDIT     Admission (Discharged) from OP Visit from 06/15/2019 in [place] [facility]  Alcohol Use Disorder Identification Test Final Score (AUDIT)  0    PHQ2-9     Office Visit from 03/09/2014 in [place] at [facility] Total Score  0       Assessment and Plan: 30 year oldsingle AAFwith history ofdepressionandgeneralized anxiety disorder who [facility].She reported being increasingly depressed, anxious and having suicidal thoughts of overdosing on medications (none were being prescribed).Shecould not identify anyspecific issues that led to the increased stress.She hasa single episode of suicidal attempt by OD as a 24 year old. She has a hx of bipolar disorder in family (maternal aunt and one sister)but denied any episodes of excessive spending, increased goal-directed activity, or euphoria.April Montes was started on sertraline in the hospital and discharged in improved condition.  Shenoticedsome initialimprovement in mood butas she stillreported some depression/anxietywe increased the dose to 100 mgin January.Shehasnotbeensleeping wellwith trazodone so we instead started zolpidem 5 mg. She still reports feeling anxious and feels that one medication makes her "hyper" while the other tries to help her sleep. She has been taking both at bedtime and it seems that a higher dose of sertraline interferes with sleep.We moved it to morning and increased dose to 150 mg but it makes her feel lethargic.She was supposed to switch to Viibryd but somemthing happened with RX and she could not fill it. She remains on 100 mg of Sertraline in AM. Her appetite isbetter andshedenies havingSI.Shereported feelinga little moredepressed and anxiousand would like to try changing antidepressant as attempts to increase dose were not successful due to intolerability.We cross-tapered off sertraline and added fluoxetine but when dose went up to 40 mg daily she started to feel lethargic. Anxiety subsided though.We then moved it to PM but daytime fatigue continues. 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  Dx: MDD recurrentin partial remission; GAD  Plan: We will try to decrease dose of fluoxetine to 20 mg (still in the evening). If mood declines she will go back to 40 mg (did not respond to bupropion in the past). Continuezolpidem 5 mg prn insomnia(uses it rarely).Next appointment in80months.The plan was discussed with patient who had an opportunity to ask questions and these were all answered. I spend11minutes inphone consultationwith the patient.    Stephanie Acre, MD 05/18/2020, 3:10 PM

## 2020-05-24 ENCOUNTER — Emergency Department (HOSPITAL_COMMUNITY)
Admission: EM | Admit: 2020-05-24 | Discharge: 2020-05-26 | Disposition: A | Discharge status: Other Acute Inpatient Hospital | Payer: Federal, State, Local not specified - PPO | Attending: Emergency Medicine | Admitting: Emergency Medicine

## 2020-05-24 ENCOUNTER — Encounter (HOSPITAL_COMMUNITY): Payer: Self-pay | Admitting: Emergency Medicine

## 2020-05-24 DIAGNOSIS — T887XXA Unspecified adverse effect of drug or medicament, initial encounter: Secondary | ICD-10-CM | POA: Diagnosis not present

## 2020-05-24 DIAGNOSIS — Z03818 Encounter for observation for suspected exposure to other biological agents ruled out: Secondary | ICD-10-CM | POA: Diagnosis not present

## 2020-05-24 DIAGNOSIS — Z20822 Contact with and (suspected) exposure to covid-19: Secondary | ICD-10-CM | POA: Insufficient documentation

## 2020-05-24 DIAGNOSIS — Z720 Tobacco use: Secondary | ICD-10-CM | POA: Insufficient documentation

## 2020-05-24 DIAGNOSIS — T426X2A Poisoning by other antiepileptic and sedative-hypnotic drugs, intentional self-harm, initial encounter: Secondary | ICD-10-CM | POA: Diagnosis not present

## 2020-05-24 DIAGNOSIS — R000 Tachycardia, unspecified: Secondary | ICD-10-CM | POA: Diagnosis not present

## 2020-05-24 DIAGNOSIS — R531 Weakness: Secondary | ICD-10-CM | POA: Diagnosis not present

## 2020-05-24 DIAGNOSIS — F29 Unspecified psychosis not due to a substance or known physiological condition: Secondary | ICD-10-CM | POA: Diagnosis not present

## 2020-05-24 DIAGNOSIS — F332 Major depressive disorder, recurrent severe without psychotic features: Secondary | ICD-10-CM | POA: Diagnosis not present

## 2020-05-24 DIAGNOSIS — T50904A Poisoning by unspecified drugs, medicaments and biological substances, undetermined, initial encounter: Secondary | ICD-10-CM

## 2020-05-24 DIAGNOSIS — T50901A Poisoning by unspecified drugs, medicaments and biological substances, accidental (unintentional), initial encounter: Secondary | ICD-10-CM | POA: Diagnosis not present

## 2020-05-24 MED ORDER — ACETAMINOPHEN 325 MG PO TABS: 650.0000 mg | ORAL_TABLET | Freq: Once | ORAL | Status: DC

## 2020-05-24 NOTE — ED Notes (Signed)
Pt vomited apx 100-239mL clear/yellow emesis w/ small chunks

## 2020-05-24 NOTE — ED Triage Notes (Signed)
Pt to ED via [facility] after reported taking Ambien 5mg  (11 pills) in attempt to harm herself.

## 2020-05-25 LAB — CBC WITH DIFFERENTIAL/PLATELET
Abs Immature Granulocytes: 0.04 10*3/uL (ref 0.00–0.07)
Basophils Absolute: 0 10*3/uL (ref 0.0–0.1)
Basophils Relative: 0 %
Eosinophils Absolute: 0 10*3/uL (ref 0.0–0.5)
Eosinophils Relative: 0 %
HCT: 40.4 % (ref 36.0–46.0)
Hemoglobin: 12 g/dL (ref 12.0–15.0)
Immature Granulocytes: 0 %
Lymphocytes Relative: 18 %
Lymphs Abs: 1.6 10*3/uL (ref 0.7–4.0)
MCH: 23.4 pg — ABNORMAL LOW (ref 26.0–34.0)
MCHC: 29.7 g/dL — ABNORMAL LOW (ref 30.0–36.0)
MCV: 78.8 fL — ABNORMAL LOW (ref 80.0–100.0)
Monocytes Absolute: 0.4 10*3/uL (ref 0.1–1.0)
Monocytes Relative: 5 %
Neutro Abs: 7.1 10*3/uL (ref 1.7–7.7)
Neutrophils Relative %: 77 %
Platelets: 247 10*3/uL (ref 150–400)
RBC: 5.13 MIL/uL — ABNORMAL HIGH (ref 3.87–5.11)
RDW: 13.5 % (ref 11.5–15.5)
WBC: 9.2 10*3/uL (ref 4.0–10.5)
nRBC: 0 % (ref 0.0–0.2)

## 2020-05-25 LAB — URINALYSIS, ROUTINE W REFLEX MICROSCOPIC
Bilirubin Urine: NEGATIVE
Glucose, UA: NEGATIVE mg/dL
Hgb urine dipstick: NEGATIVE
Ketones, ur: NEGATIVE mg/dL
Leukocytes,Ua: NEGATIVE
Nitrite: NEGATIVE
Protein, ur: NEGATIVE mg/dL
Specific Gravity, Urine: 1.018 (ref 1.005–1.030)
pH: 7 (ref 5.0–8.0)

## 2020-05-25 LAB — SALICYLATE LEVEL: Salicylate Lvl: <7 mg/dL — ABNORMAL LOW (ref 7.0–30.0)

## 2020-05-25 LAB — COMPREHENSIVE METABOLIC PANEL
ALT: 30 U/L (ref 0–44)
AST: 33 U/L (ref 15–41)
Albumin: 4.4 g/dL (ref 3.5–5.0)
Alkaline Phosphatase: 56 U/L (ref 38–126)
Anion gap: 11 (ref 5–15)
BUN: 8 mg/dL (ref 6–20)
CO2: 20 mmol/L — ABNORMAL LOW (ref 22–32)
Calcium: 9.2 mg/dL (ref 8.9–10.3)
Chloride: 106 mmol/L (ref 98–111)
Creatinine, Ser: 0.81 mg/dL (ref 0.44–1.00)
GFR calc Af Amer: >60 mL/min (ref >60)
GFR calc non Af Amer: >60 mL/min (ref >60)
Glucose, Bld: 128 mg/dL — ABNORMAL HIGH (ref 70–99)
Potassium: 4.1 mmol/L (ref 3.5–5.1)
Sodium: 137 mmol/L (ref 135–145)
Total Bilirubin: 0.6 mg/dL (ref 0.3–1.2)
Total Protein: 7.6 g/dL (ref 6.5–8.1)

## 2020-05-25 LAB — RAPID URINE DRUG SCREEN, HOSP PERFORMED
Amphetamines: NOT DETECTED
Barbiturates: NOT DETECTED
Benzodiazepines: NOT DETECTED
Cocaine: NOT DETECTED
Opiates: NOT DETECTED
Tetrahydrocannabinol: NOT DETECTED

## 2020-05-25 LAB — SARS CORONAVIRUS 2 BY RT PCR (HOSPITAL ORDER, PERFORMED IN ~~LOC~~ HOSPITAL LAB): SARS Coronavirus 2: NEGATIVE

## 2020-05-25 LAB — PREGNANCY, URINE: Preg Test, Ur: NEGATIVE

## 2020-05-25 LAB — ACETAMINOPHEN LEVEL: Acetaminophen (Tylenol), Serum: <10 ug/mL — ABNORMAL LOW (ref 10–30)

## 2020-05-25 MED ORDER — LACTATED RINGERS IV BOLUS
1000.0000 mL | Freq: Once | INTRAVENOUS | Status: AC
  Administered 2020-05-25: 1000 mL via INTRAVENOUS

## 2020-05-25 NOTE — ED Notes (Signed)
Received message from Sydell Axon that it would be a while before they could assess her.

## 2020-05-25 NOTE — ED Notes (Signed)
This RN called if there was a Comptroller available for this pt. None are available at this moment but one will be sent when available.

## 2020-05-25 NOTE — ED Provider Notes (Signed)
  Physical Exam  BP (!) 124/93 (BP Location: Right Arm)   Pulse (!) 108   Temp 98 F (36.7 C) (Oral)   Resp 20   SpO2 100%   Physical Exam  ED Course/Procedures     Procedures  MDM  Patient was seen yesterday by Dr. Clayborne Dana after an overdose. Patient is medically cleared right now. Patient was seen by psychiatry and recommend inpatient admission. Patient is stable for transfer to psych facility. See Dr. Danielle Rankin note for initial H&P and exam        Charlynne Pander, MD 05/25/20 2117

## 2020-05-25 NOTE — BH Assessment (Signed)
Per Jenanine, patient accepted to [facility] in [place], [place]. The accepting provider is Dr. Lisardo Augustin. The room number will be assigned once patient arrives to the facility. Nurse report #[phone].  

## 2020-05-25 NOTE — BH Assessment (Signed)
April Montes, PMHNP recommends in patient treatment. Patient referred to the following hospitals for consideration of a bed. [facility]      [facility]    [facility]    [facility]    [facility]    [facility]    [facility]    [facility]    [facility]    [facility]

## 2020-05-25 NOTE — ED Notes (Signed)
TTS completed; Pt currently on phone with friend, Eliberto Ivory.

## 2020-05-25 NOTE — BH Assessment (Addendum)
Patient accepted to [facility], pending a note from the EDP stating that patient is medically cleared. Discussed with provider at [facility] and a note will be entered.

## 2020-05-25 NOTE — ED Notes (Signed)
Sitter at Bedside.

## 2020-05-25 NOTE — ED Notes (Signed)
Patient talked to [facility] on the phone

## 2020-05-25 NOTE — ED Notes (Signed)
Pt Mother at Bedside; belongings including phone and medications given to mother with permission from patient:  Trazadone, 50 mg (26 pills) Zoloft, 100 mg (9 pills) Fluoxetine, 20 mg (23 pills) Ambien, 5 mg (31 pills).  Mother made aware of visiting hours and regulations.

## 2020-05-25 NOTE — BHH Counselor (Addendum)
Recommendations for Services/Supports/Treatments: Reola Calkins, PMHNP recommends in patient treatment. Patient's RN notified.

## 2020-05-25 NOTE — ED Notes (Signed)
TTS in Progress in [place]. RN Pattricia Boss made aware. Pt will be returned to [facility]015 when complete.

## 2020-05-25 NOTE — ED Notes (Signed)
Attempted to call father no answer. Patient made aware
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
## 2020-05-25 NOTE — ED Notes (Signed)
Patient up walking to bathroom, gait steady 

## 2020-05-25 NOTE — ED Notes (Signed)
Dinner Tray Ordered @ 1744. 

## 2020-05-25 NOTE — ED Notes (Signed)
Father was called to bring patient eyeglasses

## 2020-05-26 DIAGNOSIS — N3944 Nocturnal enuresis: Secondary | ICD-10-CM | POA: Diagnosis not present

## 2020-05-26 DIAGNOSIS — R519 Headache, unspecified: Secondary | ICD-10-CM | POA: Diagnosis not present

## 2020-05-26 DIAGNOSIS — Z79899 Other long term (current) drug therapy: Secondary | ICD-10-CM | POA: Diagnosis not present

## 2020-05-26 DIAGNOSIS — M419 Scoliosis, unspecified: Secondary | ICD-10-CM | POA: Diagnosis not present

## 2020-05-26 DIAGNOSIS — Y92013 Bedroom of single-family (private) house as the place of occurrence of the external cause: Secondary | ICD-10-CM | POA: Diagnosis not present

## 2020-05-26 DIAGNOSIS — F411 Generalized anxiety disorder: Secondary | ICD-10-CM | POA: Diagnosis not present

## 2020-05-26 DIAGNOSIS — K219 Gastro-esophageal reflux disease without esophagitis: Secondary | ICD-10-CM | POA: Diagnosis not present

## 2020-05-26 DIAGNOSIS — K5900 Constipation, unspecified: Secondary | ICD-10-CM | POA: Diagnosis not present

## 2020-05-26 DIAGNOSIS — F332 Major depressive disorder, recurrent severe without psychotic features: Secondary | ICD-10-CM | POA: Diagnosis not present

## 2020-05-26 DIAGNOSIS — G4700 Insomnia, unspecified: Secondary | ICD-10-CM | POA: Diagnosis not present

## 2020-05-26 DIAGNOSIS — R45851 Suicidal ideations: Secondary | ICD-10-CM | POA: Diagnosis not present

## 2020-05-26 DIAGNOSIS — D649 Anemia, unspecified: Secondary | ICD-10-CM | POA: Diagnosis not present

## 2020-05-26 DIAGNOSIS — Z915 Personal history of self-harm: Secondary | ICD-10-CM | POA: Diagnosis not present

## 2020-05-26 DIAGNOSIS — T426X2A Poisoning by other antiepileptic and sedative-hypnotic drugs, intentional self-harm, initial encounter: Secondary | ICD-10-CM | POA: Diagnosis not present

## 2020-05-26 NOTE — ED Provider Notes (Signed)
Patient has been accepted at [facility] by Dr. Lorelee Cover.    Dione Booze, MD 05/26/20 [phone]

## 2020-05-26 NOTE — ED Notes (Signed)
Pt signed appropriate documentation and transfer documentation. [facility] en route for transportation. Report given to accepting facility Maurine Minister RN). Pt made aware and called family to update them regarding placement and transportation.

## 2020-05-26 NOTE — ED Notes (Signed)
Report and documentation given to transport.

## 2020-05-27 DIAGNOSIS — F332 Major depressive disorder, recurrent severe without psychotic features: Secondary | ICD-10-CM | POA: Diagnosis not present

## 2020-05-27 DIAGNOSIS — G4700 Insomnia, unspecified: Secondary | ICD-10-CM | POA: Diagnosis not present

## 2020-05-28 DIAGNOSIS — F332 Major depressive disorder, recurrent severe without psychotic features: Secondary | ICD-10-CM | POA: Diagnosis not present

## 2020-05-31 DIAGNOSIS — G4700 Insomnia, unspecified: Secondary | ICD-10-CM | POA: Diagnosis not present

## 2020-05-31 DIAGNOSIS — F332 Major depressive disorder, recurrent severe without psychotic features: Secondary | ICD-10-CM | POA: Diagnosis not present

## 2020-06-01 DIAGNOSIS — F332 Major depressive disorder, recurrent severe without psychotic features: Secondary | ICD-10-CM | POA: Diagnosis not present

## 2020-06-04 NOTE — ED Provider Notes (Signed)
[facility] EMERGENCY DEPARTMENT Provider Note   CSN: [id number] Arrival date & time: 05/24/20  2136     History Chief Complaint  Patient presents with  . Overdose  . SI    April Montes is a 24 y.o. female.  Had a verbal altercation with her family, took 11 ambien (unk dose) with apparent suicidal intent. Found somnolent by family and brought for further eval.   Drug Overdose This is a new problem. The current episode started 1 to 2 hours ago. The problem occurs constantly. The problem has not changed since onset.Pertinent negatives include no chest pain and no abdominal pain. Nothing aggravates the symptoms. Nothing relieves the symptoms. She has tried nothing for the symptoms.       Past Medical History:  Diagnosis Date  . Anemia   . Nocturnal enuresis    hx seen by Dr. Risa Grill in the past  . OM (otitis media)     Patient Active Problem List   Diagnosis Date Noted  . GAD (generalized anxiety disorder) 07/22/2019  . Major depressive disorder, recurrent episode, in partial remission ([place]) 06/15/2019  . Primary oligomenorrhea 03/09/2014  . Irregular periods 03/09/2014  . Well adolescent visit 03/14/2013  . Back pain 01/18/2012  . Poor eating habits 01/18/2012  . ANEMIA 08/26/2010  . SCOLIOSIS 08/12/2009    Past Surgical History:  Procedure Laterality Date  . ADENOIDECTOMY    . TONSILLECTOMY       OB History    Gravida  0   Para      Term      Preterm      AB      Living        SAB      TAB      Ectopic      Multiple      Live Births              Family History  Problem Relation Age of Onset  . Hypertension Mother   . Other Mother        abn ekg but nl echo and ht rx  . Bipolar disorder Sister   . Allergy (severe) Other   . Other Other        enlarged heart maternal uncle died in sleep age 23  . Diabetes type II Other   . Bipolar disorder Maternal Aunt     Social History   Tobacco Use  . Smoking  status: Current Some Day Smoker  . Smokeless tobacco: Current User  Substance Use Topics  . Alcohol use: No  . Drug use: No    Home Medications Prior to Admission medications   Medication Sig Start Date End Date Taking? Authorizing Provider  FLUoxetine (PROZAC) 20 MG capsule Take 1 capsule (20 mg total) by mouth daily at 6 PM. 05/18/20 08/16/20 Yes Pucilowski, Olgierd A, MD  zolpidem (AMBIEN) 5 MG tablet Take 1 tablet (5 mg total) by mouth at bedtime as needed for sleep. 01/14/20 05/24/29 Yes Pucilowski, Marchia Bond, MD    Allergies    Patient has no known allergies.  Review of Systems   Review of Systems  Unable to perform ROS: Mental status change  Cardiovascular: Negative for chest pain.  Gastrointestinal: Negative for abdominal pain.    Physical Exam Updated Vital Signs BP (!) 128/97 (BP Location: Right Arm)   Pulse 84   Temp 97.6 F (36.4 C) (Oral)   Resp 16   SpO2 100%  Physical Exam Vitals and nursing note reviewed.  Constitutional:      Appearance: She is well-developed.  HENT:     Head: Normocephalic and atraumatic.     Mouth/Throat:     Mouth: Mucous membranes are moist.     Pharynx: Oropharynx is clear.  Eyes:     Pupils: Pupils are equal, round, and reactive to light.  Cardiovascular:     Rate and Rhythm: Normal rate and regular rhythm.  Pulmonary:     Effort: No respiratory distress.     Breath sounds: No stridor.  Abdominal:     General: There is no distension.  Musculoskeletal:        General: No swelling or tenderness. Normal range of motion.     Cervical back: Normal range of motion.  Skin:    General: Skin is warm and dry.  Neurological:     General: No focal deficit present.     Mental Status: She is alert.     Comments: Not oriented at this time but appears to move all extremities equally.      ED Results / Procedures / Treatments   Labs (all labs ordered are listed, but only abnormal results are displayed) Labs Reviewed  CBC WITH  DIFFERENTIAL/PLATELET - Abnormal; Notable for the following components:      Result Value   RBC 5.13 (*)    MCV 78.8 (*)    MCH 23.4 (*)    MCHC 29.7 (*)    All other components within normal limits  COMPREHENSIVE METABOLIC PANEL - Abnormal; Notable for the following components:   CO2 20 (*)    Glucose, Bld 128 (*)    All other components within normal limits  ACETAMINOPHEN LEVEL - Abnormal; Notable for the following components:   Acetaminophen (Tylenol), Serum <10 (*)    All other components within normal limits  SALICYLATE LEVEL - Abnormal; Notable for the following components:   Salicylate Lvl <7.0 (*)    All other components within normal limits  SARS CORONAVIRUS 2 BY RT PCR (HOSPITAL ORDER, PERFORMED IN [facility])  RAPID URINE DRUG SCREEN, HOSP PERFORMED  URINALYSIS, ROUTINE W REFLEX MICROSCOPIC  PREGNANCY, URINE    EKG None  Radiology No results found.  Procedures Procedures (including critical care time)  Medications Ordered in ED Medications  lactated ringers bolus 1,000 mL (0 mLs Intravenous Stopped 05/25/20 0509)    Followed by  lactated ringers bolus 1,000 mL (0 mLs Intravenous Stopped 05/25/20 2426)    ED Course  I have reviewed the triage vital signs and the nursing notes.  Pertinent labs & imaging results that were available during my care of the patient were reviewed by me and considered in my medical decision making (see chart for details).    MDM Rules/Calculators/A&P                          Suicidal intent suspected. Plan for [facility] evaluation and likely admission after metabolizing meds..   Final Clinical Impression(s) / ED Diagnoses Final diagnoses:  Drug overdose, undetermined intent, initial encounter    Rx / DC Orders ED Discharge Orders    None       Velera Lansdale, Barbara Cower, MD 06/04/20 2256

## 2020-07-12 IMAGING — US US THYROID
1 series · 14 of 25 positions shown · non-contrast
Comparison: None.

CLINICAL DATA: Evaluate for a thyroid nodule.

EXAM:
THYROID ULTRASOUND
TECHNIQUE: Ultrasound examination of the thyroid gland and adjacent soft
tissues was performed.

[Series 1: us thyroid · 0.05mm/px · 14 of 37 slices shown]
[im 1/37]
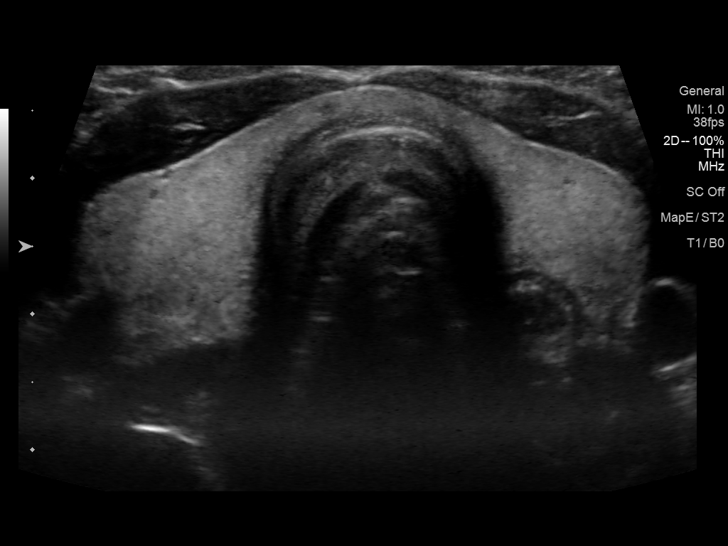
[im 4/37]
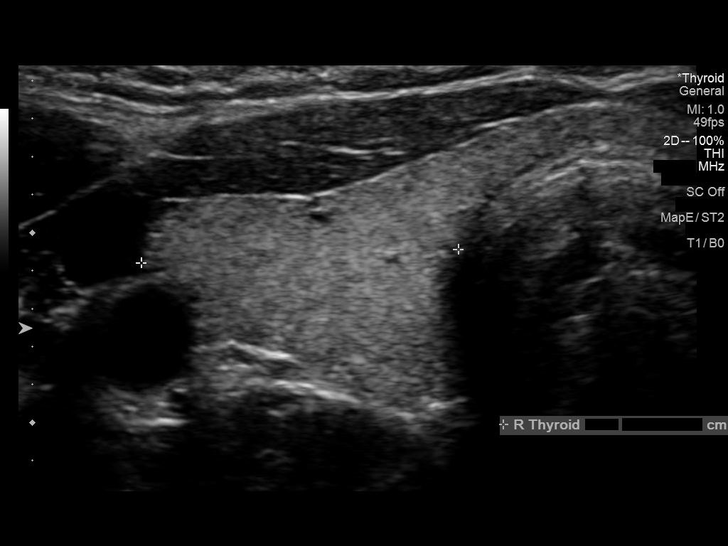
[im 7/37]
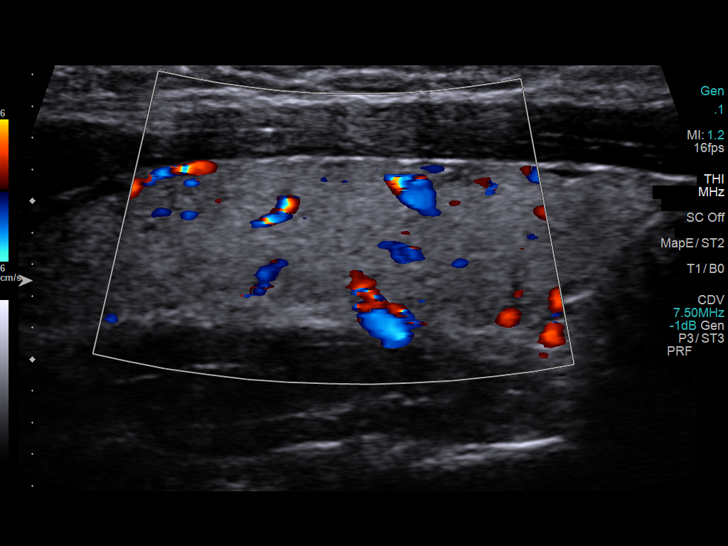
[im 10/37]
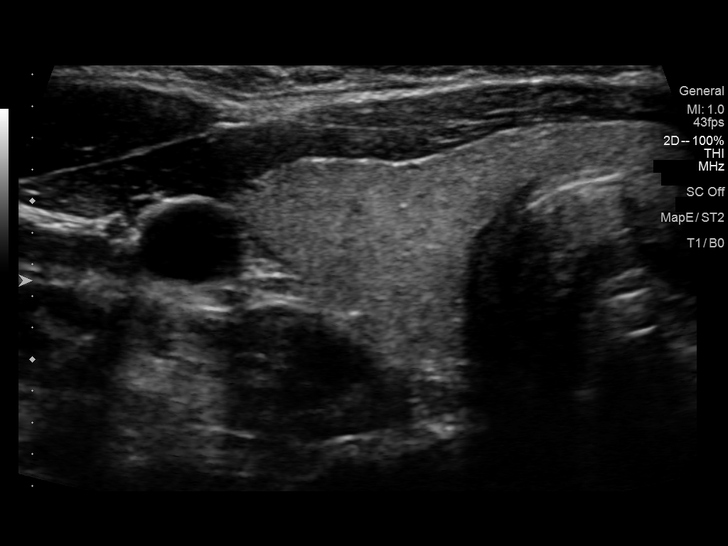
[im 13/37]
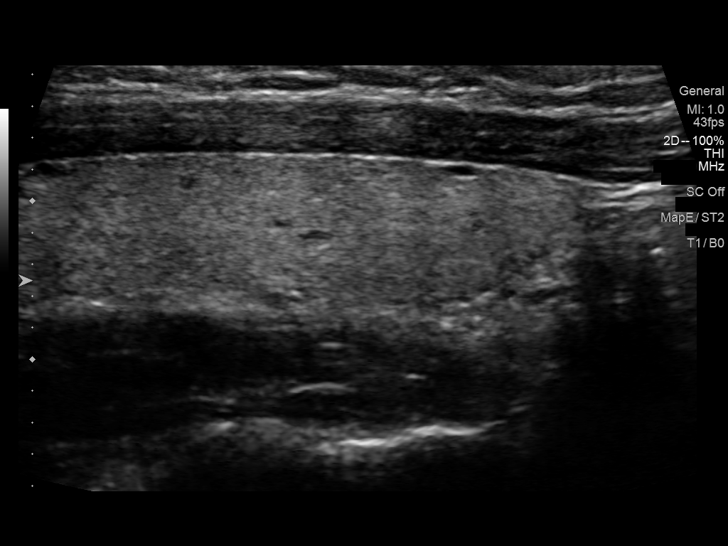
[im 14/37]
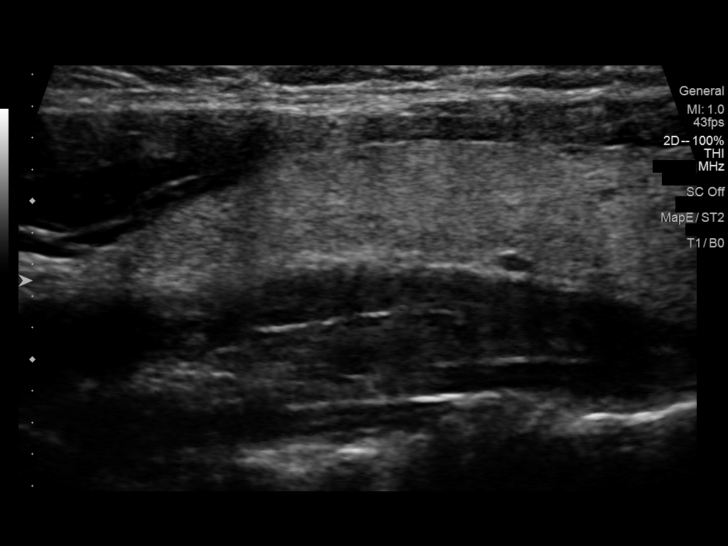
[im 17/37]
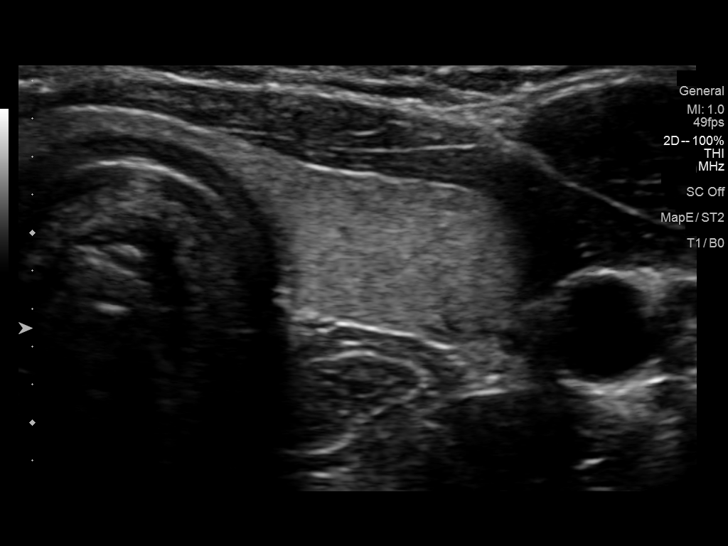
[im 20/37]
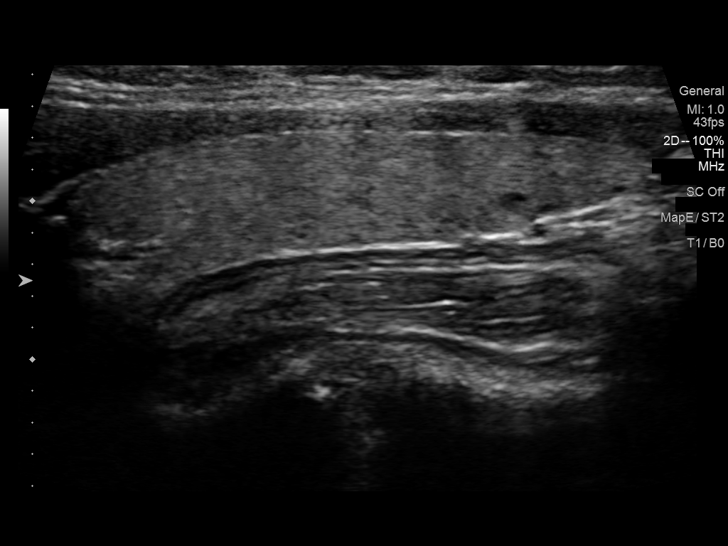
[im 23/37]
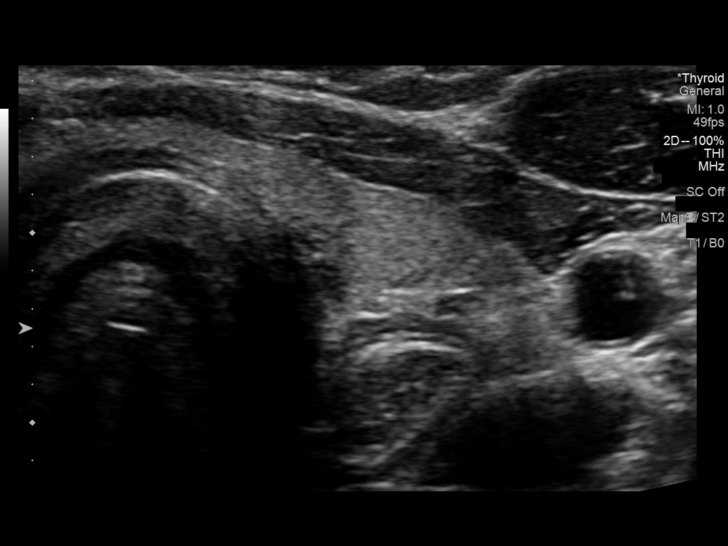
[im 25/37]
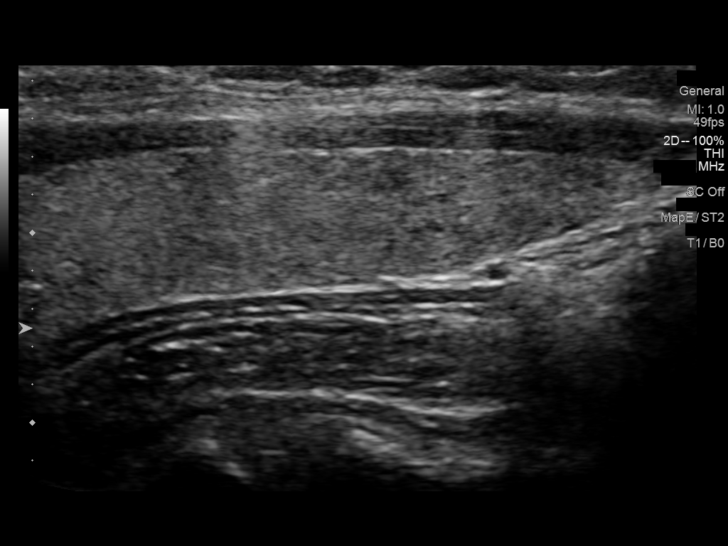
[im 28/37]
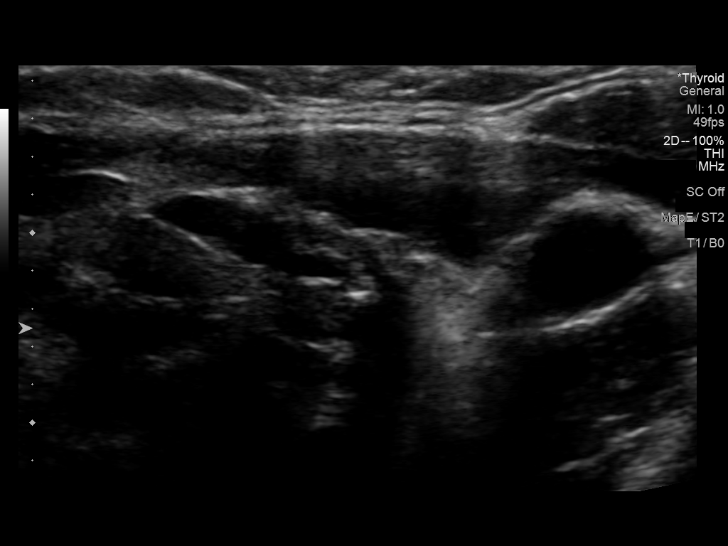
[im 31/37]
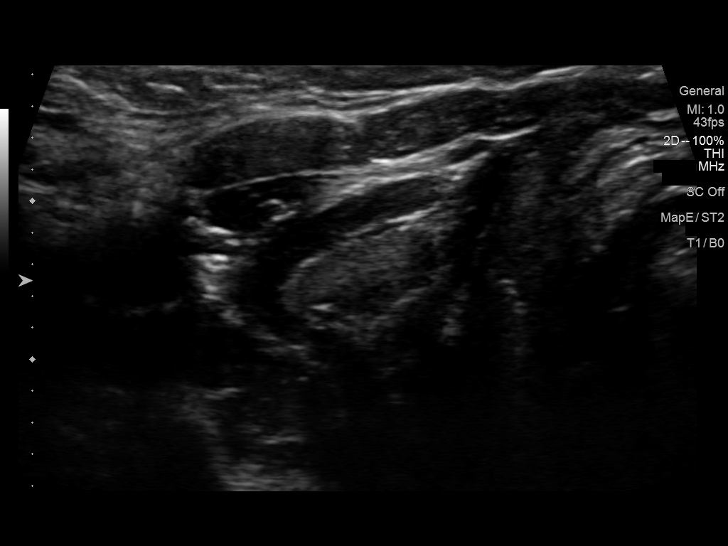
[im 34/37]
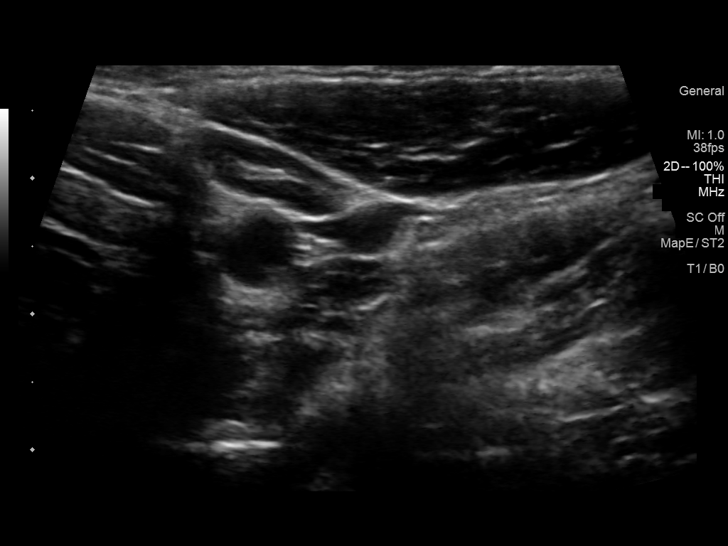
[im 37/37]
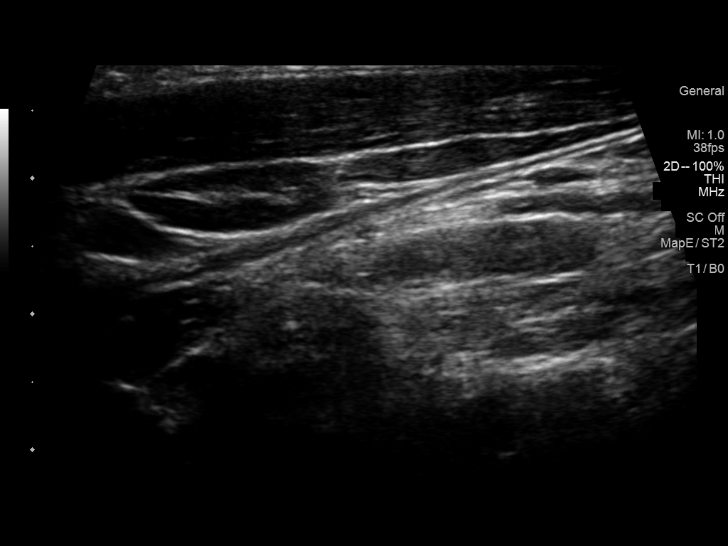

[14 of 25 positions shown; findings below may reference images not displayed]

FINDINGS: Parenchymal Echotexture: Normal

Isthmus: 0.3 cm

Right lobe: 4.3 x 1.1 x 1.7 cm

Left lobe: 3.7 x 0.8 x 1.3 cm

_________________________________________________________

Estimated total number of nodules >/= 1 cm: 0

Number of spongiform nodules >/=  2 cm not described below (TR1): 0

Number of mixed cystic and solid nodules >/= 1.5 cm not described
below (TR2): 0

_________________________________________________________

No discrete nodules are seen within the thyroid gland.
IMPRESSION: Normal thyroid ultrasound.

## 2020-07-15 ENCOUNTER — Other Ambulatory Visit: Payer: Self-pay

## 2020-07-15 ENCOUNTER — Telehealth (HOSPITAL_COMMUNITY): Payer: Federal, State, Local not specified - PPO | Admitting: Psychiatry

## 2020-09-18 DIAGNOSIS — Z111 Encounter for screening for respiratory tuberculosis: Secondary | ICD-10-CM | POA: Diagnosis not present

## 2020-09-18 DIAGNOSIS — Z23 Encounter for immunization: Secondary | ICD-10-CM | POA: Diagnosis not present

## 2020-09-20 DIAGNOSIS — Z111 Encounter for screening for respiratory tuberculosis: Secondary | ICD-10-CM | POA: Diagnosis not present

## 2020-11-29 DIAGNOSIS — R059 Cough, unspecified: Secondary | ICD-10-CM | POA: Diagnosis not present

## 2020-11-29 DIAGNOSIS — J069 Acute upper respiratory infection, unspecified: Secondary | ICD-10-CM | POA: Diagnosis not present

## 2020-11-29 DIAGNOSIS — Z20822 Contact with and (suspected) exposure to covid-19: Secondary | ICD-10-CM | POA: Diagnosis not present

## 2020-11-29 DIAGNOSIS — R519 Headache, unspecified: Secondary | ICD-10-CM | POA: Diagnosis not present

## 2020-11-29 DIAGNOSIS — R6883 Chills (without fever): Secondary | ICD-10-CM | POA: Diagnosis not present

## 2021-07-08 DIAGNOSIS — Z124 Encounter for screening for malignant neoplasm of cervix: Secondary | ICD-10-CM | POA: Diagnosis not present

## 2021-07-08 DIAGNOSIS — E669 Obesity, unspecified: Secondary | ICD-10-CM | POA: Diagnosis not present

## 2021-07-08 DIAGNOSIS — N926 Irregular menstruation, unspecified: Secondary | ICD-10-CM | POA: Diagnosis not present

## 2021-07-08 DIAGNOSIS — Z01419 Encounter for gynecological examination (general) (routine) without abnormal findings: Secondary | ICD-10-CM | POA: Diagnosis not present

## 2021-07-13 LAB — HM PAP SMEAR: HM Pap smear: NORMAL

## 2021-08-08 DIAGNOSIS — R7303 Prediabetes: Secondary | ICD-10-CM | POA: Diagnosis not present

## 2021-08-08 DIAGNOSIS — R634 Abnormal weight loss: Secondary | ICD-10-CM | POA: Diagnosis not present

## 2021-09-01 DIAGNOSIS — Z833 Family history of diabetes mellitus: Secondary | ICD-10-CM | POA: Diagnosis not present

## 2021-09-01 DIAGNOSIS — R7303 Prediabetes: Secondary | ICD-10-CM | POA: Diagnosis not present

## 2021-09-01 DIAGNOSIS — E282 Polycystic ovarian syndrome: Secondary | ICD-10-CM | POA: Diagnosis not present

## 2021-09-01 DIAGNOSIS — E139 Other specified diabetes mellitus without complications: Secondary | ICD-10-CM | POA: Diagnosis not present

## 2021-09-08 DIAGNOSIS — Z6828 Body mass index (BMI) 28.0-28.9, adult: Secondary | ICD-10-CM | POA: Diagnosis not present

## 2021-09-08 DIAGNOSIS — R634 Abnormal weight loss: Secondary | ICD-10-CM | POA: Diagnosis not present

## 2021-09-12 DIAGNOSIS — E663 Overweight: Secondary | ICD-10-CM | POA: Diagnosis not present

## 2021-09-12 DIAGNOSIS — E119 Type 2 diabetes mellitus without complications: Secondary | ICD-10-CM | POA: Diagnosis not present

## 2021-10-12 DIAGNOSIS — E559 Vitamin D deficiency, unspecified: Secondary | ICD-10-CM | POA: Diagnosis not present

## 2021-10-12 DIAGNOSIS — R634 Abnormal weight loss: Secondary | ICD-10-CM | POA: Diagnosis not present

## 2021-11-01 ENCOUNTER — Ambulatory Visit: Payer: Federal, State, Local not specified - PPO | Admitting: Registered"

## 2021-12-01 ENCOUNTER — Telehealth: Payer: Self-pay

## 2021-12-01 NOTE — Telephone Encounter (Signed)
Called and left a message to discuss upcoming appt- left a message

## 2021-12-06 ENCOUNTER — Telehealth: Payer: Self-pay

## 2021-12-06 NOTE — Telephone Encounter (Signed)
Called pt again and left another message concerning upcoming appt

## 2021-12-08 ENCOUNTER — Other Ambulatory Visit: Payer: Self-pay

## 2021-12-08 ENCOUNTER — Encounter: Payer: Self-pay | Admitting: Family Medicine

## 2021-12-08 ENCOUNTER — Ambulatory Visit (INDEPENDENT_AMBULATORY_CARE_PROVIDER_SITE_OTHER): Payer: No Typology Code available for payment source | Admitting: Family Medicine

## 2021-12-08 VITALS — BP 120/80 | HR 88 | Ht 62.5 in | Wt 150.0 lb

## 2021-12-08 DIAGNOSIS — Z8659 Personal history of other mental and behavioral disorders: Secondary | ICD-10-CM

## 2021-12-08 DIAGNOSIS — R7989 Other specified abnormal findings of blood chemistry: Secondary | ICD-10-CM

## 2021-12-08 DIAGNOSIS — Z7689 Persons encountering health services in other specified circumstances: Secondary | ICD-10-CM | POA: Diagnosis not present

## 2021-12-08 DIAGNOSIS — Z862 Personal history of diseases of the blood and blood-forming organs and certain disorders involving the immune mechanism: Secondary | ICD-10-CM | POA: Diagnosis not present

## 2021-12-08 DIAGNOSIS — Z8639 Personal history of other endocrine, nutritional and metabolic disease: Secondary | ICD-10-CM | POA: Diagnosis not present

## 2021-12-08 DIAGNOSIS — N913 Primary oligomenorrhea: Secondary | ICD-10-CM

## 2021-12-08 NOTE — Progress Notes (Signed)
Date:  12/08/2021   Name:  April Montes   DOB:  23-Nov-1996   MRN:  [id number]   Chief Complaint: Establish Care and Prediabetes (Check A1C )  Patient is a 25 year old female who presents for an establish care exam. The patient reports the following problems: establish care, check A1C. Health maintenance has been reviewed by myself.    Lab Results  Component Value Date   NA 137 05/25/2020   K 4.1 05/25/2020   CO2 20 (L) 05/25/2020   GLUCOSE 128 (H) 05/25/2020   BUN 8 05/25/2020   CREATININE 0.81 05/25/2020   CALCIUM 9.2 05/25/2020   GFRNONAA >60 05/25/2020   No results found for: CHOL, HDL, LDLCALC, LDLDIRECT, TRIG, CHOLHDL Lab Results  Component Value Date   TSH 1.788 06/15/2019   Lab Results  Component Value Date   HGBA1C 5.8 (H) 06/15/2019   Lab Results  Component Value Date   WBC 9.2 05/25/2020   HGB 12.0 05/25/2020   HCT 40.4 05/25/2020   MCV 78.8 (L) 05/25/2020   PLT 247 05/25/2020   Lab Results  Component Value Date   ALT 30 05/25/2020   AST 33 05/25/2020   ALKPHOS 56 05/25/2020   BILITOT 0.6 05/25/2020   No results found for: 25OHVITD2, 25OHVITD3, VD25OH   Review of Systems  Constitutional:  Negative for appetite change, fatigue and unexpected weight change.  HENT:  Negative for hearing loss and trouble swallowing.   Eyes:  Negative for pain and visual disturbance.  Respiratory:  Negative for chest tightness, shortness of breath and wheezing.   Cardiovascular:  Negative for chest pain, palpitations and leg swelling.  Gastrointestinal:  Negative for constipation and diarrhea.  Genitourinary:  Positive for menstrual problem.  Psychiatric/Behavioral:  Negative for agitation, behavioral problems, dysphoric mood, self-injury and suicidal ideas. The patient is not nervous/anxious.    Patient Active Problem List   Diagnosis Date Noted   GAD (generalized anxiety disorder) 07/22/2019   Major depressive disorder, recurrent episode, in partial  remission ([place]) 06/15/2019   Primary oligomenorrhea 03/09/2014   Irregular periods 03/09/2014   Well adolescent visit 03/14/2013   Back pain 01/18/2012   Poor eating habits 01/18/2012   ANEMIA 08/26/2010   SCOLIOSIS 08/12/2009    No Known Allergies  Past Surgical History:  Procedure Laterality Date   ADENOIDECTOMY     TONSILLECTOMY      Social History   Tobacco Use   Smoking status: Former    Types: Cigars    Quit date: 12/11/2017    Years since quitting: 3.9   Smokeless tobacco: Never  Substance Use Topics   Alcohol use: Yes    Comment: social   Drug use: No     Medication list has been reviewed and updated.  No outpatient medications have been marked as taking for the 12/08/21 encounter (Office Visit) with Juline Patch, MD.    [facility] 2/9 Scores 12/08/2021 03/09/2014  PHQ - 2 Score 0 0  PHQ- 9 Score 0 -    GAD 7 : Generalized Anxiety Score 12/08/2021  Nervous, Anxious, on Edge 0  Control/stop worrying 0  Worry too much - different things 0  Trouble relaxing 0  Restless 0  Easily annoyed or irritable 0  Afraid - awful might happen 0  Total GAD 7 Score 0  Anxiety Difficulty Not difficult at all    BP Readings from Last 3 Encounters:  12/08/21 120/80  05/26/20 (!) 128/97  06/03/17 123/76  Physical Exam Vitals and nursing note reviewed.  Constitutional:      Appearance: She is well-developed.  HENT:     Head: Normocephalic.     Right Ear: External ear normal.     Left Ear: External ear normal.  Eyes:     General: Lids are everted, no foreign bodies appreciated. No scleral icterus.       Left eye: No foreign body or hordeolum.     Conjunctiva/sclera: Conjunctivae normal.     Right eye: Right conjunctiva is not injected.     Left eye: Left conjunctiva is not injected.     Pupils: Pupils are equal, round, and reactive to light.  Neck:     Thyroid: No thyromegaly.     Vascular: No JVD.     Trachea: No tracheal deviation.  Cardiovascular:      Rate and Rhythm: Normal rate and regular rhythm.     Heart sounds: Normal heart sounds. No murmur heard.   No friction rub. No gallop.  Pulmonary:     Effort: Pulmonary effort is normal. No respiratory distress.     Breath sounds: Normal breath sounds. No wheezing or rales.  Abdominal:     General: Bowel sounds are normal.     Palpations: Abdomen is soft. There is no mass.     Tenderness: There is no abdominal tenderness. There is no guarding or rebound.  Musculoskeletal:        General: No tenderness. Normal range of motion.     Cervical back: Normal range of motion and neck supple.  Lymphadenopathy:     Cervical: No cervical adenopathy.  Skin:    General: Skin is warm.     Findings: No rash.  Neurological:     Mental Status: She is alert and oriented to person, place, and time.     Cranial Nerves: No cranial nerve deficit.     Deep Tendon Reflexes: Reflexes normal.  Psychiatric:        Mood and Affect: Mood is not anxious or depressed.    Wt Readings from Last 3 Encounters:  12/08/21 150 lb (68 kg)  06/03/17 123 lb (55.8 kg)  10/27/14 106 lb (48.1 kg) (14 %, Z= -1.08)*   * Growth percentiles are based on CDC (Girls, 2-20 Years) data.    BP 120/80    Pulse 88    Ht 5' 2.5" (1.588 m)    Wt 150 lb (68 kg)    LMP 09/11/2021 (Approximate) Comment: irregular periods   BMI 27.00 kg/m   Assessment and Plan:  1. Establishing care with new doctor, encounter for Patient establishing care with new physician.  No acute problems at the moment but needs some areas of her history evaluated.  2. History of hyperglycemia Patient with history of hyperglycemia we will check a renal panel for glucose even though she is 2 hours PC and A1c as well - HgB A1c  3. History of anemia Patient with history of anemia with some microcytic indices we will check CBC for evaluation. - Renal Function Panel - CBC with Differential/Platelet  4. Primary oligomenorrhea History of oligomenorrhea which  is followed by OB/GYN.  Will check A1c and renal function panel. - Renal Function Panel  5. Abnormal CBC As noted above we will check CBC for any disease and hematocrit hemoglobin. - CBC with Differential/Platelet  6. History of anxiety disorder Patient has a history of anxiety disorder and at 1 point time was on an SSRI and currently is  doing well and would like to reevaluate at this point time.  Gad score at this point time is 0. - Ambulatory referral to Psychiatry

## 2021-12-09 LAB — RENAL FUNCTION PANEL
Albumin: 5 g/dL (ref 3.9–5.0)
BUN/Creatinine Ratio: 14 (ref 9–23)
BUN: 12 mg/dL (ref 6–20)
CO2: 25 mmol/L (ref 20–29)
Calcium: 10.3 mg/dL — ABNORMAL HIGH (ref 8.7–10.2)
Chloride: 102 mmol/L (ref 96–106)
Creatinine, Ser: 0.87 mg/dL (ref 0.57–1.00)
Glucose: 112 mg/dL — ABNORMAL HIGH (ref 70–99)
Phosphorus: 3.4 mg/dL (ref 3.0–4.3)
Potassium: 4.4 mmol/L (ref 3.5–5.2)
Sodium: 143 mmol/L (ref 134–144)
eGFR: 95 mL/min/{1.73_m2} (ref >59)

## 2021-12-09 LAB — CBC WITH DIFFERENTIAL/PLATELET
Basophils Absolute: 0 10*3/uL (ref 0.0–0.2)
Basos: 0 %
EOS (ABSOLUTE): 0.1 10*3/uL (ref 0.0–0.4)
Eos: 2 %
Hematocrit: 43.3 % (ref 34.0–46.6)
Hemoglobin: 13.3 g/dL (ref 11.1–15.9)
Immature Grans (Abs): 0 10*3/uL (ref 0.0–0.1)
Immature Granulocytes: 0 %
Lymphocytes Absolute: 2.8 10*3/uL (ref 0.7–3.1)
Lymphs: 42 %
MCH: 23 pg — ABNORMAL LOW (ref 26.6–33.0)
MCHC: 30.7 g/dL — ABNORMAL LOW (ref 31.5–35.7)
MCV: 75 fL — ABNORMAL LOW (ref 79–97)
Monocytes Absolute: 0.5 10*3/uL (ref 0.1–0.9)
Monocytes: 8 %
Neutrophils Absolute: 3.2 10*3/uL (ref 1.4–7.0)
Neutrophils: 48 %
Platelets: 283 10*3/uL (ref 150–450)
RBC: 5.78 x10E6/uL — ABNORMAL HIGH (ref 3.77–5.28)
RDW: 13.9 % (ref 11.7–15.4)
WBC: 6.7 10*3/uL (ref 3.4–10.8)

## 2021-12-09 LAB — HEMOGLOBIN A1C
Est. average glucose Bld gHb Est-mCnc: 114 mg/dL
Hgb A1c MFr Bld: 5.6 % (ref 4.8–5.6)

## 2021-12-15 LAB — FERRITIN: Ferritin: 95 ng/mL (ref 15–150)

## 2021-12-15 LAB — SPECIMEN STATUS REPORT

## 2021-12-19 ENCOUNTER — Encounter: Payer: Self-pay | Admitting: Family Medicine

## 2021-12-20 ENCOUNTER — Ambulatory Visit: Payer: Federal, State, Local not specified - PPO | Admitting: Registered"

## 2021-12-23 ENCOUNTER — Other Ambulatory Visit: Payer: Self-pay

## 2022-01-06 NOTE — Progress Notes (Deleted)
°  Subjective:    April Montes - 26 y.o. female MRN [id number]  Date of birth: [birth year]  HPI  April Montes is to establish care.   Current issues and/or concerns:  ROS per HPI     Health Maintenance:  Health Maintenance Due  Topic Date Due   COVID-19 Vaccine (1) Never done   PAP-Cervical Cytology Screening  Never done   PAP SMEAR-Modifier  Never done   CHLAMYDIA SCREENING  06/03/2018     Past Medical History: Patient Active Problem List   Diagnosis Date Noted   GAD (generalized anxiety disorder) 07/22/2019   Major depressive disorder, recurrent episode, in partial remission ([facility]) 06/15/2019   Primary oligomenorrhea 03/09/2014   Irregular periods 03/09/2014   Well adolescent visit 03/14/2013   Back pain 01/18/2012   Poor eating habits 01/18/2012   ANEMIA 08/26/2010   SCOLIOSIS 08/12/2009      Social History   reports that she quit smoking about 4 years ago. Her smoking use included cigars. She has never used smokeless tobacco. She reports current alcohol use. She reports that she does not use drugs.   Family History  family history includes Allergy (severe) in an other family member; Bipolar disorder in her maternal aunt and sister; Cancer in her father; Diabetes in her father, mother, and sister; Diabetes type II in an other family member; Hypertension in her mother; Other in her mother and another family member.   Medications: reviewed and updated   Objective:   Physical Exam There were no vitals taken for this visit. Physical Exam      Assessment & Plan:         Patient was given clear instructions to go to Emergency Department or return to medical center if symptoms don't improve, worsen, or new problems develop.The patient verbalized understanding.  I discussed the assessment and treatment plan with the patient. The patient was provided an opportunity to ask questions and all were answered. The patient agreed with the plan and  demonstrated an understanding of the instructions.   The patient was advised to call back or seek an in-person evaluation if the symptoms worsen or if the condition fails to improve as anticipated.    Ricky Stabs, NP 01/06/2022, 1:33 PM Primary Care at [facility]

## 2022-01-13 ENCOUNTER — Ambulatory Visit: Payer: Federal, State, Local not specified - PPO | Admitting: Family

## 2022-01-13 DIAGNOSIS — Z7689 Persons encountering health services in other specified circumstances: Secondary | ICD-10-CM

## 2022-01-19 NOTE — Progress Notes (Deleted)
Psychiatric Initial Adult Assessment   Patient Identification: April Montes MRN:  [id number] Date of Evaluation:  01/19/2022 Referral Source: April Limerick, MD  Chief Complaint:   Visit Diagnosis: No diagnosis found.  History of Present Illness:   April Montes is a 26 y.o. year old female with a history of anxiety, depression, who is referred for depression.   ? army          Associated Signs/Symptoms: Depression Symptoms:  {DEPRESSION SYMPTOMS:20000} (Hypo) Manic Symptoms:  {BHH MANIC SYMPTOMS:22872} Anxiety Symptoms:  {BHH ANXIETY SYMPTOMS:22873} Psychotic Symptoms:  {BHH PSYCHOTIC SYMPTOMS:22874} PTSD Symptoms: {BHH PTSD SYMPTOMS:22875}  Past Psychiatric History:  Outpatient: used to be seen by Dr. Hinton Dyer Psychiatry admission: [facility] in 06/2019 for depression, SI Previous suicide attempt: overdose at age 33, overdose of Ambien on 05/2020 Past trials of medication:  History of violence:    Previous Psychotropic Medications: {YES/NO:21197}  Substance Abuse History in the last 12 months:  {yes no:314532}  Consequences of Substance Abuse: {BHH CONSEQUENCES OF SUBSTANCE ABUSE:22880}  Past Medical History:  Past Medical History:  Diagnosis Date   Anemia    Nocturnal enuresis    hx seen by Dr. Isabel Caprice in the past   OM (otitis media)     Past Surgical History:  Procedure Laterality Date   ADENOIDECTOMY     TONSILLECTOMY      Family Psychiatric History: ***  Family History:  Family History  Problem Relation Age of Onset   Diabetes Mother    Hypertension Mother    Other Mother        abn ekg but nl echo and ht rx   Diabetes Father    Cancer Father    Diabetes Sister    Bipolar disorder Sister    Bipolar disorder Maternal Aunt    Allergy (severe) Other    Other Other        enlarged heart maternal uncle died in sleep age 59   Diabetes type II Other     Social History:   Social History   Socioeconomic History   Marital status: Single     Spouse name: Not on file   Number of children: Not on file   Years of education: Not on file   Highest education level: Not on file  Occupational History   Not on file  Tobacco Use   Smoking status: Former    Types: Cigars    Quit date: 12/11/2017    Years since quitting: 4.1   Smokeless tobacco: Never  Substance and Sexual Activity   Alcohol use: Yes    Comment: social   Drug use: No   Sexual activity: Yes    Partners: Male    Birth control/protection: Condom    Comment: condoms everytime  Other Topics Concern   Not on file  Social History Narrative   HH of 4-5    Pets dog   Sleep ok 7-8 hours.    April Montes 11th grade  Good grades    No school concerns   caffiene/ tea all the time and sodas . No milk.      Born [place]   Social Determinants of Corporate investment banker Strain: Not on file  Food Insecurity: Not on file  Transportation Needs: Not on file  Physical Activity: Not on file  Stress: Not on file  Social Connections: Not on file    Additional Social History: ***  Allergies:  No Known Allergies  Metabolic Disorder Labs: Lab Results  Component Value Date   HGBA1C 5.6 12/08/2021   MPG 119.76 06/15/2019   Lab Results  Component Value Date   PROLACTIN 12.8 04/08/2014   No results found for: CHOL, TRIG, HDL, CHOLHDL, VLDL, LDLCALC Lab Results  Component Value Date   TSH 1.788 06/15/2019    Therapeutic Level Labs: No results found for: LITHIUM No results found for: CBMZ No results found for: VALPROATE  Current Medications: Current Outpatient Medications  Medication Sig Dispense Refill   phentermine 37.5 MG capsule Take 37.5 mg by mouth every morning. OBGYN     No current facility-administered medications for this visit.    Musculoskeletal: Strength & Muscle Tone:  N/A Gait & Station:  N/A Patient leans: N/A  Psychiatric Specialty Exam: Review of Systems  There were no vitals taken for this visit.There is no height or weight on  file to calculate BMI.  General Appearance: {Appearance:22683}  Eye Contact:  {BHH EYE CONTACT:22684}  Speech:  Clear and Coherent  Volume:  Normal  Mood:  {BHH MOOD:22306}  Affect:  {Affect (PAA):22687}  Thought Process:  Coherent  Orientation:  Full (Time, Place, and Person)  Thought Content:  Logical  Suicidal Thoughts:  {ST/HT (PAA):22692}  Homicidal Thoughts:  {ST/HT (PAA):22692}  Memory:  Immediate;   Good  Judgement:  {Judgement (PAA):22694}  Insight:  {Insight (PAA):22695}  Psychomotor Activity:  Normal  Concentration:  Concentration: Good and Attention Span: Good  Recall:  Good  Fund of Knowledge:Good  Language: Good  Akathisia:  No  Handed:  Right  AIMS (if indicated):  not done  Assets:  Communication Skills Desire for Improvement  ADL's:  Intact  Cognition: WNL  Sleep:  {BHH GOOD/FAIR/POOR:22877}   Screenings: AIMS    Flowsheet Row [facility] (Dis[facility]  AIMS Total Score 0      AUDIT    Flowsheet Row Admission (Discharged) [facility]  Alcohol Use Disorder Identification Test Final Score (AUDIT) 0      GAD-7    Flowsheet Row Office Visit from 12/08/2021 in [facility]  Total GAD-7 Score 0      PHQ2-9    Flowsheet Row Office Visit from 12/08/2021 in [facility] Office Visit from 03/09/2014 in [facility]  PHQ-2 Total Score 0 0  PHQ-9 Total Score 0 --      Flowsheet Row ED from 05/24/2020 in [facility]Y DEPARTMENT Admission (Discharged) [facility]  C-SSRS RISK CATEGORY High Risk High Risk       Assessment and Plan:   Plan   The patient demonstrates the following risk factors for suicide: Chronic risk factors for suicide include: {Chronic Risk Factors for  YJEHUDJ:49702637}. Acute risk factors for suicide include: {Acute Risk Factors for CHYIFOY:77412878}. Protective factors for this patient include: {Protective Factors for Suicide MVEH:20947096}. Considering these factors, the overall suicide risk at this point appears to be {Desc; low/moderate/high:110033}. Patient {ACTION; IS/IS GEZ:66294765} appropriate for outpatient follow up.         Neysa Hotter, MD 2/9/20235:16 PM

## 2022-01-20 ENCOUNTER — Telehealth: Payer: Self-pay | Admitting: Psychiatry

## 2022-01-20 ENCOUNTER — Telehealth: Payer: Federal, State, Local not specified - PPO | Admitting: Psychiatry

## 2022-01-20 ENCOUNTER — Other Ambulatory Visit: Payer: Self-pay

## 2022-01-20 NOTE — Telephone Encounter (Signed)
Sent link for video visit through Epic. Patient did not sign in. Called the patient for appointment scheduled today. The patient did not answer the phone. Left voice message to contact the office ([phone]).   ?

## 2022-01-23 NOTE — Progress Notes (Signed)
Virtual Visit via Video Note  I connected with April Montes on 01/25/22 at 11:00 AM EST by a video enabled telemedicine application and verified that I am speaking with the correct person using two identifiers.  Location: Patient: home Provider: office Persons participated in the visit- patient, provider    I discussed the limitations of evaluation and management by telemedicine and the availability of in person appointments. The patient expressed understanding and agreed to proceed.   I discussed the assessment and treatment plan with the patient. The patient was provided an opportunity to ask questions and all were answered. The patient agreed with the plan and demonstrated an understanding of the instructions.   The patient was advised to call back or seek an in-person evaluation if the symptoms worsen or if the condition fails to improve as anticipated.  I provided 42 minutes of non-face-to-face time during this encounter.   Neysa Hotter, MD     Psychiatric Initial Adult Assessment   Patient Identification: April Montes MRN:  [id number] Date of Evaluation:  01/25/2022 Referral Source: Duanne Limerick, MD  Chief Complaint:   Chief Complaint   Depression; Establish Care    Visit Diagnosis:    ICD-10-CM   1. MDD (major depressive disorder), recurrent, in full remission (HCC)  F33.42       History of Present Illness:   April Montes is a 26 y.o. year old female with a history of depression, anxiety, who is referred for depression.   She states that she made this appointment to re-evaluate her depression as she wants to join the [employer].  She reports history of depression, and she would like to "get that off from the record."  She states that she has been doing very well for the past few years. She has not taken any psychotropics since 2021.  She did not take the medication as she did not think she needs it, and she states that has been fine since then.  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  She also works at [employer], doing patient registration.  She enjoys the current work and school.  She hopes to combat medics as she wants to help people.  She states that she does have "habits" and knows how to handle things when she is stressed.  She tries to take a break when she feels overwhelmed.  She does coloring, and tries to stay busy, having good sleep when she feels anxious/sad.  She reports better relationship with her sisters and her parents.  She enjoys meeting with her friends at times.  Provided psychoeducation about the nature of depression/possible relapsing her symptoms.  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 She lives with her parents "all my life." Although she is scared of leaving there, she thinks she would do good if she were to enter the army.   Mood-she denies depressed anhedonia.  She sleeps well.  She has been working on diet , and physical activity .  She feels good that she has lost 30 pounds since being started on phentermine. She is able to focus well.  She denies SI.  She denies panic attacks.  Although she may feel anxious at times due to school test, she has been doing well, and denies any concern at this time.   Past psychiatry history-She states that she was  admitted to [facility] in 2020, being brought by her sister, although she does not recall the details.  She also states that she was admitted in June 2021 at another hospital.  She states that she was stressed at work due to change in shifts. She took certain amount of Ambien as it was not working for her for insomnia.  Although she recalls that she texted her boyfriend that she took Ambien, she denies this as a suicide attempt.  However, she has been trying to use her coping skills since these episodes so that it would not happen again.   Per note from June  2021: "Per collateral: Yesterday patient and her mother got into an argument and patient threw "temper tantrum." Patient locked herself in her room. She and her father went to check on patient and she would not wake up. At that time patient's boyfriend called patient's cell phone. Sister answered and boyfriend told her that patient sent him a text that she was taking 11 of her Ambien.  Collateral believes this was an intentional overdose and that she would benefit from a psych hospitalization."  Substance- She rarely drinks alcohol. Last use in Dec 2022, drank less than a cup of wine. She denies drug use  Medication- Phentermine 37.5 mg daily   Household: parents, sister Marital status: single Number of children: 0  Employment: Patient access for three months, [employer] [place] [employer] Education:  first year in [place], majoring in kinesiology Last PCP / ongoing medical evaluation:   She was born in [place], and grew up in [place].  She reports little support in childhood, although she denies any abuse. She states that she had "regular childhood." She reports better relationships with her parents and her sisters.  She is the youngest among 5 sisters.    170 lbs Wt Readings from Last 3 Encounters:  12/08/21 150 lb (68 kg)  06/15/19 145 lb (65.8 kg)  06/03/17 123 lb (55.8 kg)       Associated Signs/Symptoms: Depression Symptoms:   denies feeling depressed, anhedonia (Hypo) Manic Symptoms:   denies decreased need for sleep, euphoria Anxiety Symptoms:   mild anxiety Psychotic Symptoms:   denies AH, VH, paranoia PTSD Symptoms: Negative  Past Psychiatric History:  Outpatient: Dr. Hinton Dyer, last seen in June 2021. Seen a therapist once Psychiatry admission: [facility] in 06/2019 for depression, SI, another psychiatry admission in 05/2020 in the setting of taking "too much" Ambien Previous suicide attempt: "took bunch of pills," patient cannot recall details. overdose at age 45, overdose of Ambien  on 05/2020 per chart review  Past trials of medication: sertraline, fluoxetine, venlafaxine History of violence:      Previous Psychotropic Medications: Yes   Substance Abuse History in the last 12 months:  No.  Consequences of Substance Abuse: NA  Past Medical History:  Past Medical History:  Diagnosis Date   Anemia    Nocturnal enuresis    hx seen by Dr. Isabel Caprice in the past   OM (otitis media)     Past Surgical History:  Procedure Laterality Date   ADENOIDECTOMY     TONSILLECTOMY      Family Psychiatric History:  As below  Family History:  Family History  Problem Relation Age of Onset   Anxiety disorder Mother    Diabetes Mother    Hypertension Mother    Other Mother        abn ekg but nl echo and ht rx   Diabetes Father  Cancer Father    Diabetes Sister    Bipolar disorder Sister    Anxiety disorder Maternal Aunt    Bipolar disorder Maternal Aunt    Allergy (severe) Other    Other Other        enlarged heart maternal uncle died in sleep age 25   Diabetes type II Other     Social History:   Social History   Socioeconomic History   Marital status: Single    Spouse name: Not on file   Number of children: Not on file   Years of education: Not on file   Highest education level: Not on file  Occupational History   Not on file  Tobacco Use   Smoking status: Former    Types: Cigars    Quit date: 12/11/2017    Years since quitting: 4.1   Smokeless tobacco: Never  Substance and Sexual Activity   Alcohol use: Yes    Comment: social   Drug use: No   Sexual activity: Yes    Partners: Male    Birth control/protection: Condom    Comment: condoms everytime  Other Topics Concern   Not on file  Social History Narrative   HH of 4-5    Pets dog   Sleep ok 7-8 hours.    [employer] 11th grade  Good grades    No school concerns   caffiene/ tea all the time and sodas . No milk.      Born [place]   Social Determinants of Corporate investment bankerHealth   Financial Resource  Strain: Not on file  Food Insecurity: Not on file  Transportation Needs: Not on file  Physical Activity: Not on file  Stress: Not on file  Social Connections: Not on file    Additional Social History: as above  Allergies:  No Known Allergies  Metabolic Disorder Labs: Lab Results  Component Value Date   HGBA1C 5.6 12/08/2021   MPG 119.76 06/15/2019   Lab Results  Component Value Date   PROLACTIN 12.8 04/08/2014   No results found for: CHOL, TRIG, HDL, CHOLHDL, VLDL, LDLCALC Lab Results  Component Value Date   TSH 1.788 06/15/2019    Therapeutic Level Labs: No results found for: LITHIUM No results found for: CBMZ No results found for: VALPROATE  Current Medications: Current Outpatient Medications  Medication Sig Dispense Refill   phentermine 37.5 MG capsule Take 37.5 mg by mouth every morning. OBGYN     No current facility-administered medications for this visit.    Musculoskeletal: Strength & Muscle Tone:  N/A Gait & Station:  N/A Patient leans: N/A  Psychiatric Specialty Exam: Review of Systems  Psychiatric/Behavioral:  Negative for agitation, behavioral problems, confusion, decreased concentration, dysphoric mood, hallucinations, self-injury, sleep disturbance and suicidal ideas. The patient is nervous/anxious. The patient is not hyperactive.   All other systems reviewed and are negative.  There were no vitals taken for this visit.There is no height or weight on file to calculate BMI.  General Appearance: Fairly Groomed  Eye Contact:  Good  Speech:  Clear and Coherent  Volume:  Normal  Mood:   good  Affect:  Appropriate, Congruent, and Full Range  Thought Process:  Coherent  Orientation:  Full (Time, Place, and Person)  Thought Content:  Logical  Suicidal Thoughts:  No  Homicidal Thoughts:  No  Memory:  Immediate;   Good  Judgement:  Good  Insight:  Good  Psychomotor Activity:  Normal  Concentration:  Concentration: Good and Attention Span: Good  Recall:  [place] Major of Knowledge:Good  Language: Good  Akathisia:  No  Handed:  Right  AIMS (if indicated):  not done  Assets:  Communication Skills Desire for Improvement  ADL's:  Intact  Cognition: WNL  Sleep:  Good   Screenings: AIMS    Flowsheet Row Ad[facility] (Dis[facility]  AIMS Total Score 0      AUDIT    Flowsheet Row Admission (Discharged) [facility]  Alcohol Use Disorder Identification Test Final Score (AUDIT) 0      GAD-7    Flowsheet Row Office Visit from 12/08/2021 in [facility]  Total GAD-7 Score 0      PHQ2-9    Flowsheet Row Office Visit from 12/08/2021 in [facility] Office Visit from 03/09/2014 in [facility] [place]  PHQ-2 Total Score 0 0  PHQ-9 Total Score 0 --      Flowsheet Row ED from 05/24/2020 in [facility]PART[facility] Admission (Discharged) from [facility]  C-SSRS RISK CATEGORY High Risk High Risk       Assessment and Plan:  April Montes is a 26 y.o. year old female with a history of depression, anxiety, who is referred for re-evaluation of depression.   1. MDD (major depressive disorder), recurrent, in full remission (HCC) Exam is notable for euthymic affect, and she denies any significant mood symptoms except occasional self-limiting anxiety in the context of school work.  According to the chart review and the patient report, she has had at least 2 episodes of depression.  She reportedly has not taken any medication since 2021, and has not had any relapsing her mood symptoms.  She is able to verbalize coping skills and is future oriented; she enjoys work, school, and is hoping to join [employer].  Given her lack of any significant mood symptoms interfering with her daily  life, psychotropic for depression is not indicated at this time.  Provided psychoeducation about relapse prevention.  She would like a letter to be written so that she can join the [employer]; will honor this request.   Plan (No psychotropics is indicated.) Next appointment- 3/24 at 11 AM for 30 mins, video   The patient demonstrates the following risk factors for suicide: Chronic risk factors for suicide include: psychiatric disorder of depression and previous suicide attempts of overdosing medication . Acute risk factors for suicide include: N/A. Protective factors for this patient include: coping skills and hope for the future. Considering these factors, the overall suicide risk at this point appears to be low. Patient is appropriate for outpatient follow up.   The duration of this appointment visit was 42 minutes of face-to-face time with the patient.  Greater than 50% of this time was spent in counseling, explanation of  diagnosis, planning of further management, and coordination of care.   Neysa Hotter, MD 2/15/202312:16 PM

## 2022-01-25 ENCOUNTER — Telehealth (INDEPENDENT_AMBULATORY_CARE_PROVIDER_SITE_OTHER): Payer: Federal, State, Local not specified - PPO | Admitting: Psychiatry

## 2022-01-25 ENCOUNTER — Other Ambulatory Visit: Payer: Self-pay

## 2022-01-25 ENCOUNTER — Telehealth: Payer: Self-pay | Admitting: Psychiatry

## 2022-01-25 ENCOUNTER — Encounter: Payer: Self-pay | Admitting: Psychiatry

## 2022-01-25 DIAGNOSIS — F3342 Major depressive disorder, recurrent, in full remission: Secondary | ICD-10-CM

## 2022-01-25 NOTE — Patient Instructions (Signed)
Next appointment- 3/24 at 11 AM, video

## 2022-01-25 NOTE — Telephone Encounter (Signed)
Letter is created, pending consent form.

## 2022-01-30 ENCOUNTER — Other Ambulatory Visit: Payer: Self-pay | Admitting: Family Medicine

## 2022-03-01 NOTE — Progress Notes (Deleted)
BH MD/PA/NP OP Progress Note ? ?03/01/2022 8:08 AM ?April Montes  ?MRN:  [id number] ? ?Chief Complaint: No chief complaint on file. ? ?HPI: *** ?Visit Diagnosis: No diagnosis found. ? ?Past Psychiatric History: Please see initial evaluation for full details. I have reviewed the history. No updates at this time.  ?  ? ?Past Medical History:  ?Past Medical History:  ?Diagnosis Date  ? Anemia   ? Nocturnal enuresis   ? hx seen by Dr. Isabel CapriceGrapey in the past  ? OM (otitis media)   ?  ?Past Surgical History:  ?Procedure Laterality Date  ? ADENOIDECTOMY    ? TONSILLECTOMY    ? ? ?Family Psychiatric History: Please see initial evaluation for full details. I have reviewed the history. No updates at this time.  ?  ? ?Family History:  ?Family History  ?Problem Relation Age of Onset  ? Anxiety disorder Mother   ? Diabetes Mother   ? Hypertension Mother   ? Other Mother   ?     abn ekg but nl echo and ht rx  ? Diabetes Father   ? Cancer Father   ? Diabetes Sister   ? Bipolar disorder Sister   ? Anxiety disorder Maternal Aunt   ? Bipolar disorder Maternal Aunt   ? Allergy (severe) Other   ? Other Other   ?     enlarged heart maternal uncle died in sleep age 25  ? Diabetes type II Other   ? ? ?Social History:  ?Social History  ? ?Socioeconomic History  ? Marital status: Single  ?  Spouse name: Not on file  ? Number of children: Not on file  ? Years of education: Not on file  ? Highest education level: Not on file  ?Occupational History  ? Not on file  ?Tobacco Use  ? Smoking status: Former  ?  Types: Cigars  ?  Quit date: 12/11/2017  ?  Years since quitting: 4.2  ? Smokeless tobacco: Never  ?Substance and Sexual Activity  ? Alcohol use: Yes  ?  Comment: social  ? Drug use: No  ? Sexual activity: Yes  ?  Partners: Male  ?  Birth control/protection: Condom  ?  Comment: condoms everytime  ?Other Topics Concern  ? Not on file  ?Social History Narrative  ? HH of 4-5   ? Pets dog  ? Sleep ok 7-8 hours.   ? [employer] 11th grade  Good grades    ? No school concerns  ? caffiene/ tea all the time and sodas . No milk.  ?   ? Born [place]  ? ?Social Determinants of Health  ? ?Financial Resource Strain: Not on file  ?Food Insecurity: Not on file  ?Transportation Needs: Not on file  ?Physical Activity: Not on file  ?Stress: Not on file  ?Social Connections: Not on file  ? ? ?Allergies: No Known Allergies ? ?Metabolic Disorder Labs: ?Lab Results  ?Component Value Date  ? HGBA1C 5.6 12/08/2021  ? MPG 119.76 06/15/2019  ? ?Lab Results  ?Component Value Date  ? PROLACTIN 12.8 04/08/2014  ? ?No results found for: CHOL, TRIG, HDL, CHOLHDL, VLDL, LDLCALC ?Lab Results  ?Component Value Date  ? TSH 1.788 06/15/2019  ? TSH 1.542 04/08/2014  ? ? ?Therapeutic Level Labs: ?No results found for: LITHIUM ?No results found for: VALPROATE ?No components found for:  CBMZ ? ?Current Medications: ?Current Outpatient Medications  ?Medication Sig Dispense Refill  ? phentermine 37.5 MG capsule Take 37.5  mg by mouth every morning. OBGYN    ? ?No current facility-administered medications for this visit.  ? ? ? ?Musculoskeletal: ?Strength & Muscle Tone:  N/A ?Gait & Station:  N/A ?Patient leans: N/A ? ?Psychiatric Specialty Exam: ?Review of Systems  ?There were no vitals taken for this visit.There is no height or weight on file to calculate BMI.  ?General Appearance: {Appearance:22683}  ?Eye Contact:  {B[facility] EYE CONTACT:22684}  ?Speech:  Clear and Coherent  ?Volume:  Normal  ?Mood:  {B[facility] MOOD:22306}  ?Affect:  {Affect (PAA):22687}  ?Thought Process:  Coherent  ?Orientation:  Full (Time, Place, and Person)  ?Thought Content: Logical   ?Suicidal Thoughts:  {ST/HT (PAA):22692}  ?Homicidal Thoughts:  {ST/HT (PAA):22692}  ?Memory:  Immediate;   Good  ?Judgement:  {Judgement (PAA):22694}  ?Insight:  {Insight (PAA):22695}  ?Psychomotor Activity:  Normal  ?Concentration:  Concentration: Good and Attention Span: Good  ?Recall:  Good  ?Fund of Knowledge: Good  ?Language: Good  ?Akathisia:  No   ?Handed:  Right  ?AIMS (if indicated): not done  ?Assets:  Communication Skills ?Desire for Improvement  ?ADL's:  Intact  ?Cognition: WNL  ?Sleep:  {[facility] GOOD/FAIR/POOR:22877}  ? ?Screenings: ?AIMS   ? ?Flowsheet Row Admission (Discharged) [facility]  ?AIMS Total Score 0  ? ?  ? ?AUDIT   ? ?Flowsheet Row Admission (Discharged) [facility]  ?Alcohol Use Disorder Identification Test Final Score (AUDIT) 0  ? ?  ? ?GAD-7   ? ?Flowsheet Row Office Visit from 12/08/2021 in [facility]  ?Total GAD-7 Score 0  ? ?  ? ?PHQ2-9   ? ?Flowsheet Row Office Visit from 12/08/2021 in [facility] Office Visit from 03/09/2014 in [facility] [place]  ?PHQ-2 Total Score 0 0  ?PHQ-9 Total Score 0 --  ? ?  ? ?Flowsheet Row ED from 05/24/2020 in [facility] EM[facility]Y DEPARTMENT Admission (Discharged) from [facility]  ?C-SSRS RISK CATEGORY High Risk High Risk  ? ?  ? ? ? ?Assessment and Plan:  ?April Montes is a 26 y.o. year old female with a history of depression, anxiety, who presents for follow up appointment for below.  ?  ?1. MDD (major depressive disorder), recurrent, in full remission (HCC) ?Exam is notable for euthymic affect, and she denies any significant mood symptoms except occasional self-limiting anxiety in the context of school work.  According to the chart review and the patient report, she has had at least 2 episodes of depression.  She reportedly has not taken any medication since 2021, and has not had any relapsing her mood symptoms.  She is able to verbalize coping skills and is future oriented; she enjoys work, school, and is hoping to join [employer].  Given her lack of any significant mood symptoms interfering with her daily life, psychotropic for depression is not indicated at  this time.  Provided psychoeducation about relapse prevention.  She would like a letter to be written so that she can join the [employer]; will honor this request.  ?  ?Plan ?(No psychotropics is indicated.) ?Next appointment- 3/24 at 11 AM for 30 mins, video ?  ?  ?The patient demonstrates the following risk factors for suicide: Chronic risk factors for suicide include: psychiatric disorder of depression and previous suicide attempts of overdosing medication . Acute risk  factors for suicide include: N/A. Protective factors for this patient include: coping skills and hope for the future. Considering these factors, the overall suicide risk at this point appears to be low. Patient is appropriate for outpatient follow up.  ? ? ?  ? ?Collaboration of Care: Collaboration of Care: {BH OP Collaboration of GEZM:[id number]} ? ?Patient/Guardian was advised Release of Information must be obtained prior to any record release in order to collaborate their care with an outside provider. Patient/Guardian was advised if they have not already done so to contact the registration department to sign all necessary forms in order for [place] to release information regarding their care.  ? ?Consent: Patient/Guardian gives verbal consent for treatment and assignment of benefits for services provided during this visit. Patient/Guardian expressed understanding and agreed to proceed.  ? ? ?Neysa Hotter, MD ?03/01/2022, 8:08 AM ? ?

## 2022-03-03 ENCOUNTER — Telehealth: Payer: Federal, State, Local not specified - PPO | Admitting: Psychiatry

## 2022-03-03 ENCOUNTER — Other Ambulatory Visit: Payer: Self-pay

## 2022-07-06 DIAGNOSIS — Z01419 Encounter for gynecological examination (general) (routine) without abnormal findings: Secondary | ICD-10-CM | POA: Diagnosis not present

## 2022-07-06 DIAGNOSIS — Z113 Encounter for screening for infections with a predominantly sexual mode of transmission: Secondary | ICD-10-CM | POA: Diagnosis not present

## 2022-09-25 ENCOUNTER — Telehealth: Payer: Self-pay

## 2022-09-25 NOTE — Telephone Encounter (Signed)
Called pt and left a message stting we do not prescribe weight loss meds but we would be glad to see her and give a diet sheet or referral to bariatric clinic

## 2022-09-26 ENCOUNTER — Ambulatory Visit: Payer: Self-pay | Admitting: Family Medicine

## 2022-10-05 ENCOUNTER — Encounter: Payer: Self-pay | Admitting: Family Medicine

## 2022-10-09 DIAGNOSIS — E282 Polycystic ovarian syndrome: Secondary | ICD-10-CM | POA: Diagnosis not present

## 2022-10-09 DIAGNOSIS — N939 Abnormal uterine and vaginal bleeding, unspecified: Secondary | ICD-10-CM | POA: Diagnosis not present

## 2022-10-09 DIAGNOSIS — Z113 Encounter for screening for infections with a predominantly sexual mode of transmission: Secondary | ICD-10-CM | POA: Diagnosis not present

## 2022-11-01 DIAGNOSIS — E119 Type 2 diabetes mellitus without complications: Secondary | ICD-10-CM | POA: Diagnosis not present

## 2022-11-01 DIAGNOSIS — Z6827 Body mass index (BMI) 27.0-27.9, adult: Secondary | ICD-10-CM | POA: Diagnosis not present

## 2022-11-01 DIAGNOSIS — N926 Irregular menstruation, unspecified: Secondary | ICD-10-CM | POA: Diagnosis not present

## 2022-11-01 DIAGNOSIS — E282 Polycystic ovarian syndrome: Secondary | ICD-10-CM | POA: Diagnosis not present

## 2022-11-06 ENCOUNTER — Ambulatory Visit: Payer: Self-pay | Admitting: Family Medicine

## 2022-11-06 ENCOUNTER — Encounter: Payer: Self-pay | Admitting: Family Medicine

## 2022-11-13 ENCOUNTER — Ambulatory Visit: Payer: Self-pay | Admitting: Family Medicine

## 2023-02-09 ENCOUNTER — Encounter: Payer: Self-pay | Admitting: Family Medicine

## 2023-02-15 ENCOUNTER — Ambulatory Visit (INDEPENDENT_AMBULATORY_CARE_PROVIDER_SITE_OTHER): Payer: Federal, State, Local not specified - PPO | Admitting: Family Medicine

## 2023-02-15 ENCOUNTER — Encounter: Payer: Self-pay | Admitting: Family Medicine

## 2023-02-15 VITALS — BP 130/90 | HR 92 | Ht 62.5 in | Wt 156.0 lb

## 2023-02-15 DIAGNOSIS — Z0000 Encounter for general adult medical examination without abnormal findings: Secondary | ICD-10-CM | POA: Diagnosis not present

## 2023-02-15 NOTE — Progress Notes (Signed)
Date:  02/15/2023   Name:  April Montes   DOB:  [birth year]   MRN:  [id number]   Chief Complaint: Annual Exam  Patient is a 27 year old female who presents for a comprehensive physical exam. The patient reports the following problems: none. Health maintenance has been reviewed request pap.      Lab Results  Component Value Date   NA 143 12/08/2021   K 4.4 12/08/2021   CO2 25 12/08/2021   GLUCOSE 112 (H) 12/08/2021   BUN 12 12/08/2021   CREATININE 0.87 12/08/2021   CALCIUM 10.3 (H) 12/08/2021   EGFR 95 12/08/2021   GFRNONAA >60 05/25/2020   No results found for: "CHOL", "HDL", "LDLCALC", "LDLDIRECT", "TRIG", "CHOLHDL" Lab Results  Component Value Date   TSH 1.788 06/15/2019   Lab Results  Component Value Date   HGBA1C 5.6 12/08/2021   Lab Results  Component Value Date   WBC 6.7 12/08/2021   HGB 13.3 12/08/2021   HCT 43.3 12/08/2021   MCV 75 (L) 12/08/2021   PLT 283 12/08/2021   Lab Results  Component Value Date   ALT 30 05/25/2020   AST 33 05/25/2020   ALKPHOS 56 05/25/2020   BILITOT 0.6 05/25/2020   No results found for: "25OHVITD2", "25OHVITD3", "VD25OH"   Review of Systems  Constitutional:  Negative for fatigue and unexpected weight change.  HENT:  Negative for hearing loss, nosebleeds and trouble swallowing.   Eyes:  Negative for visual disturbance.  Respiratory:  Negative for shortness of breath and wheezing.   Cardiovascular:  Negative for chest pain and palpitations.  Gastrointestinal:  Negative for abdominal pain and blood in stool.  Endocrine: Negative for cold intolerance, heat intolerance, polydipsia and polyuria.  Genitourinary:  Negative for difficulty urinating and menstrual problem.  Neurological:  Negative for headaches.  Hematological:  Negative for adenopathy. Does not bruise/bleed easily.    Patient Active Problem List   Diagnosis Date Noted   Genital herpes simplex 01/12/2017   Primary oligomenorrhea 03/09/2014   Irregular  periods 03/09/2014   Well adolescent visit 03/14/2013   SCOLIOSIS 08/12/2009    No Known Allergies  Past Surgical History:  Procedure Laterality Date   ADENOIDECTOMY     TONSILLECTOMY      Social History   Tobacco Use   Smoking status: Former    Types: Cigars    Quit date: 12/11/2017    Years since quitting: 5.1   Smokeless tobacco: Never  Substance Use Topics   Alcohol use: Yes    Comment: social   Drug use: No     Medication list has been reviewed and updated.  No outpatient medications have been marked as taking for the 02/15/23 encounter (Office Visit) with Juline Patch, MD.       02/15/2023   10:31 AM 12/08/2021    2:38 PM  GAD 7 : Generalized Anxiety Score  Nervous, Anxious, on Edge 0 0  Control/stop worrying 0 0  Worry too much - different things 0 0  Trouble relaxing 0 0  Restless 0 0  Easily annoyed or irritable 0 0  Afraid - awful might happen 0 0  Total GAD 7 Score 0 0  Anxiety Difficulty Not difficult at all Not difficult at all       02/15/2023   10:31 AM 12/08/2021    2:38 PM 03/09/2014   12:56 PM  Depression screen PHQ 2/9  Decreased Interest 0 0 0  Down, Depressed, Hopeless 0 0  0  PHQ - 2 Score 0 0 0  Altered sleeping 0 0   Tired, decreased energy 0 0   Change in appetite 0 0   Feeling bad or failure about yourself  0 0   Trouble concentrating 0 0   Moving slowly or fidgety/restless 0 0   Suicidal thoughts 0 0   PHQ-9 Score 0 0   Difficult doing work/chores Not difficult at all Not difficult at all     BP Readings from Last 3 Encounters:  02/15/23 (!) 130/90  12/08/21 120/80  05/26/20 (!) 128/97    Physical Exam Vitals and nursing note reviewed.  Constitutional:      Appearance: She is well-developed and well-groomed.  HENT:     Head: Normocephalic.     Right Ear: Hearing, tympanic membrane, ear canal and external ear normal.     Left Ear: Hearing, tympanic membrane, ear canal and external ear normal.     Nose: Nose normal.      Mouth/Throat:     Mouth: Mucous membranes are moist.     Dentition: Normal dentition.     Pharynx: Oropharynx is clear. Uvula midline.  Eyes:     General: Lids are normal. Lids are everted, no foreign bodies appreciated. Vision grossly intact. No scleral icterus.       Left eye: No foreign body or hordeolum.     Conjunctiva/sclera: Conjunctivae normal.     Right eye: Right conjunctiva is not injected.     Left eye: Left conjunctiva is not injected.     Pupils: Pupils are equal, round, and reactive to light.  Neck:     Thyroid: No thyroid mass, thyromegaly or thyroid tenderness.     Vascular: Normal carotid pulses. No carotid bruit, hepatojugular reflux or JVD.     Trachea: Trachea normal. No tracheal deviation.  Cardiovascular:     Rate and Rhythm: Normal rate and regular rhythm.     Pulses: Normal pulses.     Heart sounds: Normal heart sounds, S1 normal and S2 normal. No murmur heard.    No systolic murmur is present.     No diastolic murmur is present.     No friction rub. No gallop. No S3 or S4 sounds.  Pulmonary:     Effort: Pulmonary effort is normal. No respiratory distress.     Breath sounds: Normal breath sounds. No decreased breath sounds, wheezing, rhonchi or rales.  Chest:  Breasts:    Right: Normal. No swelling, bleeding, inverted nipple, mass, nipple discharge, skin change or tenderness.     Left: Normal. No swelling, bleeding, inverted nipple, mass, nipple discharge, skin change or tenderness.  Abdominal:     General: Bowel sounds are normal.     Palpations: Abdomen is soft. There is no hepatomegaly, splenomegaly or mass.     Tenderness: There is no abdominal tenderness. There is no guarding or rebound.  Musculoskeletal:        General: No tenderness. Normal range of motion.     Cervical back: Full passive range of motion without pain, normal range of motion and neck supple.  Lymphadenopathy:     Cervical: No cervical adenopathy.     Right cervical: No  superficial, deep or posterior cervical adenopathy.    Left cervical: No superficial, deep or posterior cervical adenopathy.     Upper Body:     Right upper body: No supraclavicular or axillary adenopathy.     Left upper body: No supraclavicular or axillary adenopathy.  Skin:    General: Skin is warm.     Capillary Refill: Capillary refill takes less than 2 seconds.     Findings: No rash.  Neurological:     Mental Status: She is alert and oriented to person, place, and time.     Cranial Nerves: Cranial nerves 2-12 are intact. No cranial nerve deficit.     Sensory: Sensation is intact.     Motor: Motor function is intact.     Coordination: Romberg sign negative.     Deep Tendon Reflexes: Reflexes are normal and symmetric. Reflexes normal.  Psychiatric:        Mood and Affect: Mood is not anxious or depressed.        Behavior: Behavior is cooperative.     Wt Readings from Last 3 Encounters:  02/15/23 156 lb (70.8 kg)  12/08/21 150 lb (68 kg)  06/03/17 123 lb (55.8 kg)    BP (!) 130/90   Pulse 92   Ht 5' 2.5" (1.588 m)   Wt 156 lb (70.8 kg)   SpO2 99%   BMI 28.08 kg/m   Assessment and Plan: April Montes is a 27 y.o. female who presents today for her Complete Annual Exam. She feels well. She reports exercising . She reports she is sleeping well.  Immunizations are reviewed and recommendations provided.   Age appropriate screening tests are discussed. Counseling given for risk factor reduction interventions.   1. Annual physical exam No subjective/objective concerns noted during HPI, review of past medical history and medications, review of systems and physical exam.  Will obtain lipid panel and CMP.  Per request of physical application patient needs a QuantiFERON TB Gold plus and this has been ordered. - Lipid Panel With LDL/HDL Ratio - Comprehensive Metabolic Panel (CMET) - QuantiFERON-TB Gold Plus   Otilio Miu, MD

## 2023-02-15 NOTE — Patient Instructions (Signed)
GUIDELINES FOR  LOW-CHOLESTEROL, LOW-TRIGLYCERIDE DIETS    FOODS TO USE   MEATS, FISH Choose lean meats (chicken, turkey, veal, and non-fatty cuts of beef with excess fat trimmed; one serving = 3 oz of cooked meat). Also, fresh or frozen fish, canned fish packed in water, and shellfish (lobster, crabs, shrimp, and oysters). Limit use to no more than one serving of one of these per week. Shellfish are high in cholesterol but low in saturated fat and should be used sparingly. Meats and fish should be broiled (pan or oven) or baked on a rack.  EGGS Egg substitutes and egg whites (use freely). Egg yolks (limit two per week).  FRUITS Eat three servings of fresh fruit per day (1 serving =  cup). Be sure to have at least one citrus fruit daily. Frozen and canned fruit with no sugar or syrup added may be used.  VEGETABLES Most vegetables are not limited (see next page). One dark-green (string beans, escarole) or one deep yellow (squash) vegetable is recommended daily. Cauliflower, broccoli, and celery, as well as potato skins, are recommended for their fiber content. (Fiber is associated with cholesterol reduction) It is preferable to steam vegetables, but they may be boiled, strained, or braised with polyunsaturated vegetable oil (see below).  BEANS Dried peas or beans (1 serving =  cup) may be used as a bread substitute.  NUTS Almonds, walnuts, and peanuts may be used sparingly  (1 serving = 1 Tablespoonful). Use pumpkin, sesame, or sunflower seeds.  BREADS, GRAINS One roll or one slice of whole grain or enriched bread may be used, or three soda crackers or four pieces of melba toast as a substitute. Spaghetti, rice or noodles ( cup) or  large ear of corn may be used as a bread substitute. In preparing these foods do not use butter or shortening, use soft margarine. Also use egg and sugar substitutes.  Choose high fiber grains, such as oats and whole wheat.  CEREALS Use  cup of hot cereal or  cup of  cold cereal per day. Add a sugar substitute if desired, with 99% fat free or skim milk.  MILK PRODUCTS Always use 99% fat free or skim milk, dairy products such as low fat cheeses (farmer's uncreamed diet cottage), low-fat yogurt, and powdered skim milk.  FATS, OILS Use soft (not stick) margarine; vegetable oils that are high in polyunsaturated fats (such as safflower, sunflower, soybean, corn, and cottonseed). Always refrigerate meat drippings to harden the fat and remove it before preparing gravies  DESSERTS, SNACKS Limit to two servings per day; substitute each serving for a bread/cereal serving: ice milk, water sherbet (1/4 cup); unflavored gelatin or gelatin flavored with sugar substitute (1/3 cup); pudding prepared with skim milk (1/2 cup); egg white souffls; unbuttered popcorn (1  cups). Substitute carob for chocolate.  BEVERAGES Fresh fruit juices (limit 4 oz per day); black coffee, plain or herbal teas; soft drinks with sugar substitutes; club soda, preferably salt-free; cocoa made with skim milk or nonfat dried milk and water (sugar substitute added if desired); clear broth. Alcohol: limit two servings per day (see second page).  MISCELLANEOUS  You may use the following freely: vinegar, spices, herbs, nonfat bouillon, mustard, Worcestershire sauce, soy sauce, flavoring essence.                  GUIDELINES FOR  LOW-CHOLESTEROL, LOW TRIGLYCERIDE DIETS    FOODS TO AVOID   MEATS, FISH Marbled beef, pork, bacon, sausage, and other pork products; fatty   fowl (duck, goose); skin and fat of turkey and chicken; processed meats; luncheon meats (salami, bologna); frankfurters and fast-food hamburgers (theyre loaded with fat); organ meats (kidneys, liver); canned fish packed in oil.  EGGS Limit egg yolks to two per week.   FRUITS Coconuts (rich in saturated fats).  VEGETABLES Avoid avocados. Starchy vegetables (potatoes, corn, lima beans, dried peas, beans) may be used only if  substitutes for a serving of bread or cereal. (Baked potato skin, however, is desirable for its fiber content.  BEANS Commercial baked beans with sugar and/or pork added.  NUTS Avoid nuts.  Limit peanuts and walnuts to one tablespoonful per day.  BREADS, GRAINS Any baked goods with shortening and/or sugar. Commercial mixes with dried eggs and whole milk. Avoid sweet rolls, doughnuts, breakfast pastries (Danish), and sweetened packaged cereals (the added sugar converts readily to triglycerides).  MILK PRODUCTS Whole milk and whole-milk packaged goods; cream; ice cream; whole-milk puddings, yogurt, or cheeses; nondairy cream substitutes.  FATS, OILS Butter, lard, animal fats, bacon drippings, gravies, cream sauces as well as palm and coconut oils. All these are high in saturated fats. Examine labels on cholesterol free products for hydrogenated fats. (These are oils that have been hardened into solids and in the process have become saturated.)  DESSERTS, SNACKS Fried snack foods like potato chips; chocolate; candies in general; jams, jellies, syrups; whole- milk puddings; ice cream and milk sherbets; hydrogenated peanut butter.  BEVERAGES Sugared fruit juices and soft drinks; cocoa made with whole milk and/or sugar. When using alcohol (1 oz liquor, 5 oz beer, or 2  oz dry table wine per serving), one serving must be substituted for one bread or cereal serving (limit, two servings of alcohol per day).   SPECIAL NOTES    Remember that even non-limited foods should be used in moderation. While on a cholesterol-lowering diet, be sure to avoid animal fats and marbled meats. 3. While on a triglyceride-lowering diet, be sure to avoid sweets and to control the amount of carbohydrates you eat (starchy foods such as flour, bread, potatoes).While on a tri-glyceride-lowering diet, be sure to avoid sweets Buy a good low-fat cookbook, such as the one published by the American Heart Association. Consult your physician  if you have any questions.               [facility] Low Glycemic Diet Plan   Low Glycemic Foods (20-49) Moderate Glycemic Foods (50-69) High Glycemic Foods (70-100)      Breakfast Creals Breakfast Cereals Breakfast Cereals  All Bran All-Bran Fruit'n Oats   Bran Buds Bran Chex   Cheerios Corn chex    Fiber One Oatmeal (not instant)   Just Right Mini-Wheats   Corn Flakes Cream of Wheat    Oat Bran Special K Swiss Muesli   Grape Nuts Grape Nut Flakes      Grits Nutri-Grain    Fruits and fruit juice: Fruits Puffed Rice Puffed Wheat    (Limit to 1-2 Servings per day) Banana (under-ride) Dates   Rice Chex Rice Krispies    Apples Apricots (fresh/dried)   Figs Grapes   Shredded Wheat Team    Blackberries Blueberries   Kiwi Mango   Total     Cherries Cranberries   Oranges Raisins     Peaches Pears    Fruits  Plums Prunes   Fruit Juices Pineapple Watermelon    Grapefruit Raspberries   Cranberry Juice Orange Juice   Banana (over-ripe)     Strawberries Tangerines        Apple Juice Grapefruit Juice   Beans and Legumes Beverages  Tomato Juice    Boston-type baked beans Sodas, sweet tea, pineapple juice   Canned pinto, kidney, or navy beans   Beans and Legumes (fresh-cooked) Green peas Vegetables  Black-eyed peas Butter Beans    Potato, baked, boiled, fried, mashed  Chick peas Lentils   Vegetables French fries  Green beans Lima beans   Beets Carrots   Canned or frozen corn  Kidney beans Navy beans   Sweet potato Yam   Parsnips  Pinto beans Snow peas   Corn on the cob Winter squash      Non-starchy vegetables Grains Breads  Asparagus, avocado, broccoli, cabbage Cornmeal Rice, brown   Most breads (white and whole grain)  cauliflower, celery, cucumber, greens Rice, white Couscous   Bagels Bread sticks    lettuce, mushrooms, peppers, tomatoes  Bread stuffing Kaiser roll    okra, onions, spinach, summer squash Pasta Dinner rolls   Macaroni  Pizza, cheese     Grains Ravioli, meat filled Spaghetti, white   Grains  Barley Bulgur    Rice, instant Tapioca, with milk    Rye Wild rice   Nuts    Cashews Macadamia   Candy and most cookies  Nuts and oils    Almonds, peanuts, sunflower seeds Snacks Snacks  hazelnuts, pecans, walnuts Chocolate Ice cream, lowfat   Donuts Corn chips    Oils that are liquid at room temperature Muffin Popcorn   Jelly beans Pretzels      Pastries  Dairy, fish, meat, soy, and eggs    Milk, skim Lowfat cheese    Restaurant and ethnic foods  Yogurt, lowfat, fruit sugar sweetened  Most Chinese food (sugar in stir fry    or wok sauce)  Lean red meat Fish    Teriyaki-style meats and vegetables  Skinless chicken and turkey, shellfish        Egg whites (up to 3 daily), Soy Products    Egg yolks (up to 7 or _____ per week)      

## 2023-02-20 ENCOUNTER — Telehealth: Payer: Self-pay

## 2023-02-20 NOTE — Telephone Encounter (Signed)
Called pt and [facility]- Quantiferon Gold is not resulted yet

## 2023-02-22 LAB — LIPID PANEL WITH LDL/HDL RATIO
Cholesterol, Total: 187 mg/dL (ref 100–199)
HDL: 57 mg/dL (ref >39)
LDL Chol Calc (NIH): 108 mg/dL — ABNORMAL HIGH (ref 0–99)
LDL/HDL Ratio: 1.9 ratio (ref 0.0–3.2)
Triglycerides: 123 mg/dL (ref 0–149)
VLDL Cholesterol Cal: 22 mg/dL (ref 5–40)

## 2023-02-22 LAB — COMPREHENSIVE METABOLIC PANEL
ALT: 42 IU/L — ABNORMAL HIGH (ref 0–32)
AST: 34 IU/L (ref 0–40)
Albumin/Globulin Ratio: 1.9 (ref 1.2–2.2)
Albumin: 4.7 g/dL (ref 4.0–5.0)
Alkaline Phosphatase: 71 IU/L (ref 44–121)
BUN/Creatinine Ratio: 16 (ref 9–23)
BUN: 14 mg/dL (ref 6–20)
Bilirubin Total: 0.4 mg/dL (ref 0.0–1.2)
CO2: 19 mmol/L — ABNORMAL LOW (ref 20–29)
Calcium: 9.2 mg/dL (ref 8.7–10.2)
Chloride: 103 mmol/L (ref 96–106)
Creatinine, Ser: 0.9 mg/dL (ref 0.57–1.00)
Globulin, Total: 2.5 g/dL (ref 1.5–4.5)
Glucose: 106 mg/dL — ABNORMAL HIGH (ref 70–99)
Potassium: 4.3 mmol/L (ref 3.5–5.2)
Sodium: 137 mmol/L (ref 134–144)
Total Protein: 7.2 g/dL (ref 6.0–8.5)
eGFR: 91 mL/min/{1.73_m2} (ref >59)

## 2023-02-22 LAB — QUANTIFERON-TB GOLD PLUS
QuantiFERON Mitogen Value: >10 IU/mL
QuantiFERON Nil Value: 0.01 IU/mL
QuantiFERON TB1 Ag Value: 0.01 IU/mL
QuantiFERON TB2 Ag Value: 0.02 IU/mL
QuantiFERON-TB Gold Plus: NEGATIVE

## 2023-02-28 ENCOUNTER — Encounter: Payer: Self-pay | Admitting: Family Medicine

## 2024-11-11 ENCOUNTER — Ambulatory Visit: Admitting: Family Medicine

## 2024-11-11 ENCOUNTER — Encounter: Payer: Self-pay | Admitting: Family Medicine

## 2024-11-11 VITALS — BP 134/90 | HR 88 | Temp 98.8°F | Ht 62.5 in | Wt 159.1 lb

## 2024-11-11 DIAGNOSIS — E282 Polycystic ovarian syndrome: Secondary | ICD-10-CM | POA: Insufficient documentation

## 2024-11-11 DIAGNOSIS — F319 Bipolar disorder, unspecified: Secondary | ICD-10-CM | POA: Insufficient documentation

## 2024-11-11 DIAGNOSIS — R45851 Suicidal ideations: Secondary | ICD-10-CM | POA: Insufficient documentation

## 2024-11-11 DIAGNOSIS — G44229 Chronic tension-type headache, not intractable: Secondary | ICD-10-CM | POA: Insufficient documentation

## 2024-11-11 DIAGNOSIS — K921 Melena: Secondary | ICD-10-CM

## 2024-11-11 DIAGNOSIS — R030 Elevated blood-pressure reading, without diagnosis of hypertension: Secondary | ICD-10-CM | POA: Insufficient documentation

## 2024-11-11 DIAGNOSIS — N939 Abnormal uterine and vaginal bleeding, unspecified: Secondary | ICD-10-CM

## 2024-11-11 DIAGNOSIS — F32A Depression, unspecified: Secondary | ICD-10-CM | POA: Insufficient documentation

## 2024-11-11 DIAGNOSIS — R7303 Prediabetes: Secondary | ICD-10-CM

## 2024-11-11 MED ORDER — ARIPIPRAZOLE 2 MG PO TABS: 2.0000 mg | ORAL_TABLET | Freq: Every day | ORAL | 3 refills | Status: DC

## 2024-11-11 MED ORDER — SUMATRIPTAN SUCCINATE 25 MG PO TABS: 25.0000 mg | ORAL_TABLET | ORAL | 0 refills | Status: DC | PRN

## 2024-11-11 NOTE — Progress Notes (Signed)
 New patient visit   Patient: April Montes   DOB: April 08, 1996   28 y.o. Female  MRN: [id number] Visit Date: 11/11/2024  Today's healthcare provider: Rockie Agent, MD   Chief Complaint  Patient presents with   Establish Care    Patient presents to establish care with new PCP.  Diet is normal per patient, not exercising outside of work.    Headache    Patient would like to discuss frequent headaches she us  having, occurring almost daily. Unsure of trigger, mostly feeling pain in temple areas. Patient does not use any OTC meds otc meds to help, seems to go away on their own    Subjective    April Montes is a 28 y.o. female who presents today as a new patient to establish care.   HPI     Establish Care    Additional comments: Patient presents to establish care with new PCP.  Diet is normal per patient, not exercising outside of work.         Headache    Additional comments: Patient would like to discuss frequent headaches she us  having, occurring almost daily. Unsure of trigger, mostly feeling pain in temple areas. Patient does not use any OTC meds otc meds to help, seems to go away on their own       Last edited by Cherry Chiquita HERO, CMA on 11/11/2024  9:21 AM.       Discussed the use of AI scribe software for clinical note transcription with the patient, who gave verbal consent to proceed.  History of Present Illness NAVJOT Montes is a 28 year old female who presents with frequent headaches.  She experiences frequent headaches almost daily, primarily located in the temple areas, described as a squeezing pressure that can last all day. The headaches often worsen upon waking or after being woken up during the night for work. There are no specific triggers identified, and the headaches resolve spontaneously without medication. These headaches have been present since middle school, subsided for a period, and have recently returned.  She has a  history of elevated blood pressure, with a recent reading of 140/93 mmHg, although she has not been formally diagnosed with hypertension. She does not engage in regular exercise outside of work.  She has a history of prediabetes, with her highest recorded A1c being 5.8. She was previously on a trial of Ozempic and managed to lower her A1c through dietary changes. She has not had recent lab checks for diabetes.  She has a history of PCOS, which has contributed to irregular periods and prolonged bleeding that lasts for about a month with a few days off. She was previously on metformin but discontinued it. The bleeding is not associated with her menstrual cycle.  She has a history of depression and was diagnosed with bipolar disorder. She has experienced manic episodes and has a history of suicidal ideation and attempts, with the last attempt occurring in 2021. She has been on various medications for depression, including trazodone , but discontinued them due to side effects. She is not currently on any psychiatric medications and has not been in therapy recently.  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 She reports that the nature of her job may contribute to her headaches and stress levels. She maintains a normal diet.      Past Medical History:  Diagnosis Date   Anemia    Anxiety 06/2021   Same as depression, I don't  have anxiety. Never had an anxiety attack of felt anxous.   Depression 06/2019   checked myself into a [facility], was diagnosed and prescibed medicine. I feel as if now, this is not the case. It's been over a year since any depressive episodes.   Nocturnal enuresis    hx seen by Dr. Alline in the past   PCOS (polycystic ovarian syndrome)     No outpatient medications prior to visit.   No facility-administered medications prior to visit.    Past Surgical History:  Procedure Laterality Date   ADENOIDECTOMY     TONSILLECTOMY     Family Status  Relation Name  Status   Mother Meleana Commerford Alive   Father  Alive   Sister Diamonique Essex Alive   Sister  Alive   Sister  Alive   Sister  Alive   Sister  Alive   Mat Aunt  (Not Specified)   Other  (Not Specified)   Other  (Not Specified)   Other  (Not Specified)   Mat Uncle Christopher Buren Alive  No partnership data on file   Family History  Problem Relation Age of Onset   Anxiety disorder Mother    Diabetes Mother    Hypertension Mother    Other Mother        abn ekg but nl echo and ht rx   Diabetes Father    Cancer Father    Diabetes Sister    Bipolar disorder Sister    Anxiety disorder Maternal Aunt    Bipolar disorder Maternal Aunt    Allergy (severe) Other    Other Other        enlarged heart maternal uncle died in sleep age 4   Diabetes type II Other    Early death Maternal Uncle    Social History   Socioeconomic History   Marital status: Single    Spouse name: Not on file   Number of children: Not on file   Years of education: Not on file   Highest education level: Bachelor's degree (e.g., BA, AB, BS)  Occupational History   Not on file  Tobacco Use   Smoking status: Former    Current packs/day: 0.00    Average packs/day: 0.3 packs/day for 4.0 years (1.0 ttl pk-yrs)    Types: Cigars, Cigarettes    Start date: 12/11/2014    Quit date: 12/11/2017    Years since quitting: 6.9   Smokeless tobacco: Never  Substance and Sexual Activity   Alcohol use: Yes    Comment: Social drinker   Drug use: Never   Sexual activity: Yes    Partners: Male    Birth control/protection: Condom    Comment: condoms everytime  Other Topics Concern   Not on file  Social History Narrative   HH of 4-5    Pets dog   Sleep ok 7-8 hours.    [employer] 11th grade  Good grades    No school concerns   caffiene/ tea all the time and sodas . No milk.      [place]   Social Drivers of Health   Financial Resource Strain: Low Risk  (11/07/2024)   Overall Financial Resource Strain  (CARDIA)    Difficulty of Paying Living Expenses: Not hard at all  Food Insecurity: No Food Insecurity (11/07/2024)   Hunger Vital Sign    Worried About Running Out of Food in the Last Year: Never true    Ran Out of Food in the  Last Year: Never true  Transportation Needs: No Transportation Needs (11/07/2024)   PRAPARE - Administrator, Civil Service (Medical): No    Lack of Transportation (Non-Medical): No  Physical Activity: Insufficiently Active (11/07/2024)   Exercise Vital Sign    Days of Exercise per Week: 4 days    Minutes of Exercise per Session: 20 min  Stress: Stress Concern Present (11/07/2024)   [employer] of Occupational Health - Occupational Stress Questionnaire    Feeling of Stress: Rather much  Social Connections: Socially Isolated (11/07/2024)   Social Connection and Isolation Panel    Frequency of Communication with Friends and Family: Once a week    Frequency of Social Gatherings with Friends and Family: Never    Attends Religious Services: Never    Database Administrator or Organizations: No    Attends Banker Meetings: Not on file    Marital Status: Never married     No Known Allergies  Immunization History  Administered Date(s) Administered   HPV Quadrivalent 01/18/2012, 03/20/2012, 09/11/2012   Hep B, Unspecified 04/02/1997, 05/06/1997, 11/24/1997   Influenza Inj Mdck Quad Pf 09/18/2020   Influenza Split 01/18/2012, 09/11/2012   Influenza Whole 08/26/2010   Influenza,inj,Quad PF,6+ Mos 11/03/2019   Influenza-Unspecified 10/02/2021   MMR 03/08/1998, 07/23/2001   Meningococcal Acwy, Unspecified 08/12/2009, 03/09/2014   Meningococcal Conjugate 03/09/2014   Meningococcal polysaccharide vaccine (MPSV4) 08/12/2009   Moderna Sars-Covid-2 Vaccination 09/19/2020, 10/17/2020   PPD Test 09/18/2020   Td 07/14/2008   Tdap 07/14/2008, 01/11/2015, 08/05/2018   Varicella 03/08/1998, 10/18/2006    Health Maintenance  Topic Date  Due   COVID-19 Vaccine (3 - Moderna risk series) 11/27/2024 (Originally 11/14/2020)   Influenza Vaccine  03/10/2025 (Originally 07/11/2024)   Cervical Cancer Screening (Pap smear)  07/06/2025   DTaP/Tdap/Td (4 - Td or Tdap) 08/05/2028   HPV VACCINES  Completed   Hepatitis C Screening  Completed   HIV Screening  Completed   Pneumococcal Vaccine  Aged Out   Meningococcal B Vaccine  Aged Out    Patient Care Team: Sharma Coyer, MD as PCP - General (Family Medicine)  Review of Systems  Last CBC Lab Results  Component Value Date   WBC 6.7 12/08/2021   HGB 13.3 12/08/2021   HCT 43.3 12/08/2021   MCV 75 (L) 12/08/2021   MCH 23.0 (L) 12/08/2021   RDW 13.9 12/08/2021   PLT 283 12/08/2021   Last metabolic panel Lab Results  Component Value Date   GLUCOSE 106 (H) 02/15/2023   NA 137 02/15/2023   K 4.3 02/15/2023   CL 103 02/15/2023   CO2 19 (L) 02/15/2023   BUN 14 02/15/2023   CREATININE 0.90 02/15/2023   EGFR 91 02/15/2023   CALCIUM 9.2 02/15/2023   PHOS 3.4 12/08/2021   PROT 7.2 02/15/2023   ALBUMIN 4.7 02/15/2023   LABGLOB 2.5 02/15/2023   AGRATIO 1.9 02/15/2023   BILITOT 0.4 02/15/2023   ALKPHOS 71 02/15/2023   AST 34 02/15/2023   ALT 42 (H) 02/15/2023   ANIONGAP 11 05/25/2020   Last lipids Lab Results  Component Value Date   CHOL 187 02/15/2023   HDL 57 02/15/2023   LDLCALC 108 (H) 02/15/2023   TRIG 123 02/15/2023   Last hemoglobin A1c Lab Results  Component Value Date   HGBA1C 5.6 12/08/2021   Last thyroid  functions Lab Results  Component Value Date   TSH 1.788 06/15/2019   Last vitamin D No results found for: 25OHVITD2, 25OHVITD3,  VD25OH Last vitamin B12 and Folate No results found for: VITAMINB12, FOLATE      Objective    BP (!) 134/90 (Cuff Size: Normal)   Pulse 88   Temp 98.8 F (37.1 C) (Oral)   Ht 5' 2.5 (1.588 m)   Wt 159 lb 1.6 oz (72.2 kg)   SpO2 100%   BMI 28.64 kg/m   BP Readings from Last 3 Encounters:   11/11/24 (!) 134/90  02/15/23 (!) 130/90  12/08/21 120/80   Wt Readings from Last 3 Encounters:  11/11/24 159 lb 1.6 oz (72.2 kg)  02/15/23 156 lb (70.8 kg)  12/08/21 150 lb (68 kg)        Depression Screen    11/11/2024    9:10 AM 02/15/2023   10:31 AM 12/08/2021    2:38 PM 03/09/2014   12:56 PM  PHQ 2/9 Scores  PHQ - 2 Score 6 0 0 0  PHQ- 9 Score 23 0  0       Data saved with a previous flowsheet row definition   No results found for any visits on 11/11/24.   Physical Exam Vitals reviewed.  Constitutional:      General: She is not in acute distress.    Appearance: Normal appearance. She is not ill-appearing.  Neck:     Thyroid : No thyroid  mass, thyromegaly or thyroid  tenderness.  Cardiovascular:     Rate and Rhythm: Normal rate and regular rhythm.  Pulmonary:     Effort: Pulmonary effort is normal. No respiratory distress.     Breath sounds: No wheezing, rhonchi or rales.  Musculoskeletal:     Cervical back: Full passive range of motion without pain. No pain with movement.  Neurological:     Mental Status: She is alert and oriented to person, place, and time.  Psychiatric:        Mood and Affect: Mood normal.        Behavior: Behavior normal.        Assessment & Plan      Problem List Items Addressed This Visit     Bipolar depression (HCC)   Relevant Medications   ARIPiprazole (ABILIFY) 2 MG tablet   Other Relevant Orders   Ambulatory referral to Psychiatry   Chronic tension-type headache, not intractable   Relevant Medications   SUMAtriptan (IMITREX) 25 MG tablet   Other Relevant Orders   TSH + free T4   Elevated blood pressure reading in office without diagnosis of hypertension   Relevant Orders   TSH + free T4   CMP14+EGFR   PCOS (polycystic ovarian syndrome) - Primary   Relevant Orders   Ambulatory referral to Gynecology   TSH + free T4   Suicidal ideation   Other Visit Diagnoses       Vaginal bleeding problems       Relevant Orders    Ambulatory referral to Gynecology   Vitamin B12   Folate     Blood in stool       Relevant Orders   Ambulatory referral to Gastroenterology   CBC with Differential   Vitamin B12   Folate     Prediabetes       Relevant Orders   Hemoglobin A1c       Assessment and Plan Assessment & Plan Chronic tension-type headache Chronic daily headaches since 12th school, primarily in the temple areas, described as a squeezing pressure. Headaches worsen upon waking and resolve by the next day. No over-the-counter medication use due to  past suicide attempt involving pills. Differential includes tension-type headache rather than migraine. - Prescribed Imitrex 25 mg for abortive therapy, with instructions to take one dose at headache onset and an additional dose if needed after two hours, not exceeding two doses in 24 hours.  Bipolar disorder with depressive symptoms and suicidal ideation Chronic condition  Bipolar disorder with depressive symptoms and suicidal ideation. History of manic episodes and past suicide attempts. Current depressive symptoms with a THQ-9 score of 23. Previous medications caused adverse effects. No current psychiatric care due to psychiatrist retirement. - Prescribed Abilify 2 mg daily to manage depressive symptoms and stabilize mood. - Referred to psychiatry for ongoing management and therapy.  Polycystic ovarian syndrome with abnormal uterine bleeding Chronic condition  PCOS with irregular periods and abnormal uterine bleeding lasting up to a month. Previous use of metformin discontinued due to mood side effects. No current gynecological care due to recent insurance changes. - Referred to gynecology for evaluation and management of abnormal uterine bleeding.  Rectal bleeding Chronic condition  Intermittent rectal bleeding with bright red blood on stool and during urination. Differential includes hemorrhoids or other gastrointestinal issues. No recent colonoscopy. -  Referred to gastroenterology for evaluation and possible colonoscopy.  Elevated blood pressure without hypertension Chronically episodes of elevated BP  Elevated blood pressure readings, including 140/93 today. No formal diagnosis of hypertension. Blood pressure remains elevated during medical visits. - Rechecked blood pressure during the visit.  Prediabetes Chronic  Previous A1c of 5.8. Current A1c status unknown. Previous management included dietary changes and trial of Ozempic. - Ordered A1c test to assess current glycemic control.      Return in about 1 month (around 12/12/2024) for mood f/u, BP f/u, bleeding .     Total time spent on today's visit was 60 minutes, including both face-to-face time interviewing and examining the patient, reviewing medical record including labs/imaging/specialist notes, developing and discussing further evaluation,answering patient's questions, ordering referrals to specialists, coordinating follow up care in addition to documenting in the patient's chart.      Rockie Agent, MD  [facility] [phone] (phone) [phone] (fax)  [facility]

## 2024-11-11 NOTE — Patient Instructions (Signed)
 To keep you healthy, please keep in mind the following health maintenance items that you are due for:   There are no preventive care reminders to display for this patient.   Best Wishes,   Dr. Lang

## 2024-11-12 ENCOUNTER — Ambulatory Visit: Payer: Self-pay | Admitting: Family Medicine

## 2024-11-12 LAB — CMP14+EGFR
ALT: 82 IU/L — ABNORMAL HIGH (ref 0–32)
AST: 54 IU/L — ABNORMAL HIGH (ref 0–40)
Albumin: 4.8 g/dL (ref 4.0–5.0)
Alkaline Phosphatase: 73 IU/L (ref 41–116)
BUN/Creatinine Ratio: 15 (ref 9–23)
BUN: 13 mg/dL (ref 6–20)
Bilirubin Total: <0.2 mg/dL (ref 0.0–1.2)
CO2: 22 mmol/L (ref 20–29)
Calcium: 10.1 mg/dL (ref 8.7–10.2)
Chloride: 102 mmol/L (ref 96–106)
Creatinine, Ser: 0.89 mg/dL (ref 0.57–1.00)
Globulin, Total: 2.8 g/dL (ref 1.5–4.5)
Glucose: 97 mg/dL (ref 70–99)
Potassium: 4.4 mmol/L (ref 3.5–5.2)
Sodium: 141 mmol/L (ref 134–144)
Total Protein: 7.6 g/dL (ref 6.0–8.5)
eGFR: 91 mL/min/1.73 (ref >59)

## 2024-11-12 LAB — CBC WITH DIFFERENTIAL/PLATELET
Basophils Absolute: 0 x10E3/uL (ref 0.0–0.2)
Basos: 1 %
EOS (ABSOLUTE): 0.1 x10E3/uL (ref 0.0–0.4)
Eos: 1 %
Hematocrit: 44.3 % (ref 34.0–46.6)
Hemoglobin: 13.6 g/dL (ref 11.1–15.9)
Immature Grans (Abs): 0 x10E3/uL (ref 0.0–0.1)
Immature Granulocytes: 0 %
Lymphocytes Absolute: 2.6 x10E3/uL (ref 0.7–3.1)
Lymphs: 35 %
MCH: 23.5 pg — ABNORMAL LOW (ref 26.6–33.0)
MCHC: 30.7 g/dL — ABNORMAL LOW (ref 31.5–35.7)
MCV: 77 fL — ABNORMAL LOW (ref 79–97)
Monocytes Absolute: 0.5 x10E3/uL (ref 0.1–0.9)
Monocytes: 7 %
Neutrophils Absolute: 4.1 x10E3/uL (ref 1.4–7.0)
Neutrophils: 55 %
Platelets: 362 x10E3/uL (ref 150–450)
RBC: 5.78 x10E6/uL — ABNORMAL HIGH (ref 3.77–5.28)
RDW: 13.8 % (ref 11.7–15.4)
WBC: 7.4 x10E3/uL (ref 3.4–10.8)

## 2024-11-12 LAB — HEMOGLOBIN A1C
Est. average glucose Bld gHb Est-mCnc: 123 mg/dL
Hgb A1c MFr Bld: 5.9 % — ABNORMAL HIGH (ref 4.8–5.6)

## 2024-11-12 LAB — VITAMIN B12: Vitamin B-12: 1037 pg/mL (ref 232–1245)

## 2024-11-12 LAB — TSH+FREE T4
Free T4: 1.19 ng/dL (ref 0.82–1.77)
TSH: 0.869 u[IU]/mL (ref 0.450–4.500)

## 2024-11-12 LAB — FOLATE: Folate: 8.2 ng/mL (ref >3.0)

## 2024-12-15 ENCOUNTER — Ambulatory Visit
Admission: RE | Admit: 2024-12-15 | Discharge: 2024-12-15 | Disposition: A | Discharge status: Home / Self Care | Source: Ambulatory Visit | Attending: Family Medicine | Admitting: Family Medicine

## 2024-12-15 DIAGNOSIS — R748 Abnormal levels of other serum enzymes: Secondary | ICD-10-CM | POA: Diagnosis present

## 2024-12-16 ENCOUNTER — Ambulatory Visit: Payer: Self-pay | Admitting: Family Medicine

## 2024-12-17 ENCOUNTER — Ambulatory Visit: Admitting: Family Medicine

## 2024-12-17 ENCOUNTER — Encounter: Payer: Self-pay | Admitting: Family Medicine

## 2024-12-17 VITALS — BP 133/86 | HR 91 | Temp 98.4°F | Ht 62.5 in | Wt 159.9 lb

## 2024-12-17 DIAGNOSIS — R030 Elevated blood-pressure reading, without diagnosis of hypertension: Secondary | ICD-10-CM

## 2024-12-17 DIAGNOSIS — F331 Major depressive disorder, recurrent, moderate: Secondary | ICD-10-CM

## 2024-12-17 DIAGNOSIS — G44229 Chronic tension-type headache, not intractable: Secondary | ICD-10-CM

## 2024-12-17 DIAGNOSIS — E282 Polycystic ovarian syndrome: Secondary | ICD-10-CM | POA: Insufficient documentation

## 2024-12-17 DIAGNOSIS — K760 Fatty (change of) liver, not elsewhere classified: Secondary | ICD-10-CM

## 2024-12-17 DIAGNOSIS — N913 Primary oligomenorrhea: Secondary | ICD-10-CM | POA: Diagnosis not present

## 2024-12-17 DIAGNOSIS — N926 Irregular menstruation, unspecified: Secondary | ICD-10-CM

## 2024-12-17 DIAGNOSIS — F319 Bipolar disorder, unspecified: Secondary | ICD-10-CM

## 2024-12-17 MED ORDER — SUMATRIPTAN SUCCINATE 25 MG PO TABS: 25.0000 mg | ORAL_TABLET | ORAL | 1 refills | Status: AC | PRN

## 2024-12-17 MED ORDER — VALPROIC ACID 250 MG PO CAPS: 250.0000 mg | ORAL_CAPSULE | Freq: Two times a day (BID) | ORAL | 1 refills | Status: AC

## 2024-12-17 MED ORDER — RIZATRIPTAN BENZOATE 10 MG PO TABS: 10.0000 mg | ORAL_TABLET | ORAL | 0 refills | Status: AC | PRN

## 2024-12-17 NOTE — Progress Notes (Signed)
 "  Established Patient Office Visit  Patient ID: April Montes, female    DOB: August 08, 1996  Age: 29 y.o. MRN: [id number]  PCP: Sharma Coyer, MD  Chief Complaint  Patient presents with   Medical Management of Chronic Issues    Patient is present for choric follow up with pcp, reports doing well since last visit     Subjective:     HPI  Discussed the use of AI scribe software for clinical note transcription with the patient, who gave verbal consent to proceed.  History of Present Illness April Montes is a 29 year old female with chronic headaches who presents for follow-up and medication refills.  She experiences daily headaches and has been using Imitrex  25 mg as needed. Recently, she requires two doses for relief. Resting or sleeping after taking the medication helps alleviate the headaches, which often occur during work.  She reports having a recent ultrasound, which she was told showed fatty liver changes. She reports that she has a gastroenterology appointment scheduled.  She is currently taking Abilify  2 mg, which she finds ineffective for her symptoms. She is concerned about potential side effects such as weight gain and drowsiness, which are particularly challenging given her work schedule and history of PCOS. 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  Her mood is variable, with periods of depression and manic-like symptoms, especially when sleep-deprived. She has a history of prediabetes with insulin resistance related to PCOS and is concerned about medications affecting her blood sugar levels.  She reports irregular menstrual periods, with current bleeding that does not require a pad but is noticeable when using the bathroom. She is uncertain if this bleeding is related to her menstrual cycle. 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  Constitutional:      General: She is not in acute distress.    Appearance: Normal appearance. She is not ill-appearing.  Cardiovascular:     Rate and Rhythm: Normal rate and regular rhythm.  Pulmonary:     Effort: Pulmonary effort is normal. No respiratory distress.     Breath sounds: No wheezing, rhonchi or rales.  Neurological:  Mental Status: She is alert and oriented to person, place, and time.  Psychiatric:        Attention and Perception: Attention normal. She is attentive. She does not perceive auditory or visual hallucinations.        Mood and Affect: Mood and affect normal.        Speech: Speech normal.        Behavior: Behavior normal. Behavior is cooperative.      No results found for any visits on 12/17/24.  Last CBC Lab Results  Component Value Date   WBC 7.4 11/11/2024   HGB 13.6 11/11/2024   HCT 44.3 11/11/2024   MCV 77  (L) 11/11/2024   MCH 23.5 (L) 11/11/2024   RDW 13.8 11/11/2024   PLT 362 11/11/2024   Last metabolic panel Lab Results  Component Value Date   GLUCOSE 97 11/11/2024   NA 141 11/11/2024   K 4.4 11/11/2024   CL 102 11/11/2024   CO2 22 11/11/2024   BUN 13 11/11/2024   CREATININE 0.89 11/11/2024   EGFR 91 11/11/2024   CALCIUM 10.1 11/11/2024   PHOS 3.4 12/08/2021   PROT 7.6 11/11/2024   ALBUMIN 4.8 11/11/2024   LABGLOB 2.8 11/11/2024   AGRATIO 1.9 02/15/2023   BILITOT <0.2 11/11/2024   ALKPHOS 73 11/11/2024   AST 54 (H) 11/11/2024   ALT 82 (H) 11/11/2024   ANIONGAP 11 05/25/2020   Last lipids Lab Results  Component Value Date   CHOL 187 02/15/2023   HDL 57 02/15/2023   LDLCALC 108 (H) 02/15/2023   TRIG 123 02/15/2023   Last hemoglobin A1c Lab Results  Component Value Date   HGBA1C 5.9 (H) 11/11/2024   Last thyroid  functions Lab Results  Component Value Date   TSH 0.869 11/11/2024   FREET4 1.19 11/11/2024    US  Abdomen Limited RUQ (LIVER/GB) Result Date: 12/15/2024 CLINICAL DATA:  elevated liver enzymes EXAM: ULTRASOUND ABDOMEN LIMITED RIGHT UPPER QUADRANT COMPARISON:  None Available. FINDINGS: Gallbladder: No gallstones or wall thickening visualized. No sonographic Murphy sign noted by sonographer. Common bile duct: Diameter: 2.1 mm Liver: Slight increased parenchymal echogenicity. No large focal hepatic abnormality by ultrasound. No biliary dilatation or obstruction. Portal vein is patent on color Doppler imaging with normal direction of blood flow towards the liver. Other: No free fluid or ascites. Included views of the right kidney demonstrate no hydronephrosis. IMPRESSION: 1. Slight increased hepatic parenchymal echogenicity, nonspecific but can be seen with hepatic steatosis. 2. No other acute finding by right upper quadrant ultrasound. Electronically Signed   By: CHRISTELLA.  Shick M.D.   On: 12/15/2024 10:19     The ASCVD Risk score (Arnett DK, et al., 2019) failed to  calculate for the following reasons:   The 2019 ASCVD risk score is only valid for ages 63 to 37   * - Cholesterol units were assumed    Assessment & Plan:   Problem List Items Addressed This Visit     Bipolar depression (HCC) - Primary   Bipolar disorder Chronic  Current treatment with Abilify  2 mg is ineffective. 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 Mood fluctuates between manic and depressive states, influenced by sleep patterns. - Discontinued Abilify . - Initiated valproic  acid 250 mg twice daily for mood stabilization. - Referred to integrated behavioral health for medication management support.       Relevant Medications   valproic  acid (DEPAKENE ) 250 MG capsule   Other Relevant  Orders   Amb ref to Integrated Behavioral Health   Chronic tension-type headache, not intractable   Chronic tension-type headache Chronic tension-type headaches occurring daily, partially relieved by Imitrex . Recent increase in frequency requiring two doses for relief. No prior preventive treatment tried. - Prescribed Maxalt  10 mg for abortive treatment of headaches. - Continue Imitrex  25 mg as needed if Maxalt  is ineffective. - Prescribed Topamax 25 mg for preventive treatment of headaches. - Document response to Maxalt  for insurance purposes.      Relevant Medications   SUMAtriptan  (IMITREX ) 25 MG tablet   valproic  acid (DEPAKENE ) 250 MG capsule   rizatriptan  (MAXALT ) 10 MG tablet   Depression   Chronic  Uncontrolled but stable  Has been referred to psychiatry, pt given number to call from referral note for scheduling  DC abilify   Start valproic  acid 250mg  BID  for mood stabilizer  Referral to integrated behavioral health placed today       12/17/2024   10:51 AM 11/11/2024    9:10 AM 02/15/2023   10:31 AM  PHQ9 SCORE ONLY  PHQ-9 Total Score 26 23 0      Data saved with a previous flowsheet row definition         Relevant  Medications   valproic  acid (DEPAKENE ) 250 MG capsule   Other Relevant Orders   Amb ref to Integrated Behavioral Health   Elevated blood pressure reading in office without diagnosis of hypertension   BP has normalized  -CTM       Hepatic steatosis   Hepatic steatosis Chronic  Fatty liver changes noted on ultrasound. - Continue monitoring liver function and follow up with gastroenterology for further evaluation.      Irregular periods   Referred to GYN       Polycystic ovaries   Pt prefers to avoid OCP  Referred to GYN       Primary oligomenorrhea   Previously referred to GYN  Pt has not been able to schedule yet  Pt was given phone number to call from referral message       Patient verbally consented to [facility] services about presenting concerns and psychiatric consultation as appropriate.  The services will be billed as appropriate for the patient  Assessment and Plan Assessment & Plan        Return in about 2 months (around 02/14/2025) for BPD,headaches .    Rockie Agent, MD [facility]   "

## 2024-12-17 NOTE — Assessment & Plan Note (Signed)
 Chronic  Uncontrolled but stable  Has been referred to psychiatry, pt given number to call from referral note for scheduling  DC abilify   Start valproic  acid 250mg  BID  for mood stabilizer  Referral to integrated behavioral health placed today       12/17/2024   10:51 AM 11/11/2024    9:10 AM 02/15/2023   10:31 AM  PHQ9 SCORE ONLY  PHQ-9 Total Score 26 23 0      Data saved with a previous flowsheet row definition

## 2024-12-17 NOTE — Assessment & Plan Note (Signed)
 Chronic tension-type headache Chronic tension-type headaches occurring daily, partially relieved by Imitrex . Recent increase in frequency requiring two doses for relief. No prior preventive treatment tried. - Prescribed Maxalt  10 mg for abortive treatment of headaches. - Continue Imitrex  25 mg as needed if Maxalt  is ineffective. - Prescribed Topamax 25 mg for preventive treatment of headaches. - Document response to Maxalt  for insurance purposes.

## 2024-12-17 NOTE — Assessment & Plan Note (Signed)
Referred to GYN

## 2024-12-17 NOTE — Patient Instructions (Addendum)
 Psychiatry: If you have not been contacted within 5 business days, please call [phone] to make an appointment.    Gynecology: If you have not been contacted within 5 business days, please call [phone] to make an appointment.   To keep you healthy, please keep in mind the following health maintenance items that you are due for:   Health Maintenance Due  Topic Date Due   COVID-19 Vaccine (3 - 2025-26 season) 08/11/2024     Best Wishes,   Dr. Lang

## 2024-12-17 NOTE — Assessment & Plan Note (Signed)
 Pt prefers to avoid OCP  Referred to GYN

## 2024-12-17 NOTE — Assessment & Plan Note (Signed)
 Hepatic steatosis Chronic  Fatty liver changes noted on ultrasound. - Continue monitoring liver function and follow up with gastroenterology for further evaluation.

## 2024-12-17 NOTE — Assessment & Plan Note (Signed)
 BP has normalized  -CTM

## 2024-12-17 NOTE — Assessment & Plan Note (Signed)
 Previously referred to GYN  Pt has not been able to schedule yet  Pt was given phone number to call from referral message

## 2024-12-17 NOTE — Assessment & Plan Note (Signed)
 Bipolar disorder Chronic  Current treatment with Abilify  2 mg is ineffective. 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 Mood fluctuates between manic and depressive states, influenced by sleep patterns. - Discontinued Abilify . - Initiated valproic  acid 250 mg twice daily for mood stabilization. - Referred to integrated behavioral health for medication management support.

## 2025-01-12 ENCOUNTER — Ambulatory Visit: Payer: Self-pay | Admitting: Licensed Clinical Social Worker

## 2025-01-28 ENCOUNTER — Ambulatory Visit: Admitting: Family Medicine

## 2025-03-02 ENCOUNTER — Ambulatory Visit: Admitting: Family Medicine
